# Patient Record
Sex: Male | Born: 1950 | Race: White | Hispanic: No | Smoking: Former smoker
Health system: Southern US, Community
[De-identification: ages and names within clinical notes are randomized; demographics above are authoritative.]

## PROBLEM LIST (undated history)

## (undated) DIAGNOSIS — N200 Calculus of kidney: Secondary | ICD-10-CM

## (undated) DIAGNOSIS — N62 Hypertrophy of breast: Secondary | ICD-10-CM

## (undated) DIAGNOSIS — N179 Acute kidney failure, unspecified: Secondary | ICD-10-CM

## (undated) DIAGNOSIS — G9341 Metabolic encephalopathy: Secondary | ICD-10-CM

## (undated) DIAGNOSIS — I10 Essential (primary) hypertension: Secondary | ICD-10-CM

## (undated) DIAGNOSIS — E119 Type 2 diabetes mellitus without complications: Secondary | ICD-10-CM

## (undated) DIAGNOSIS — G2581 Restless legs syndrome: Secondary | ICD-10-CM

## (undated) DIAGNOSIS — M069 Rheumatoid arthritis, unspecified: Secondary | ICD-10-CM

## (undated) DIAGNOSIS — E7220 Disorder of urea cycle metabolism, unspecified: Secondary | ICD-10-CM

## (undated) DIAGNOSIS — J449 Chronic obstructive pulmonary disease, unspecified: Secondary | ICD-10-CM

## (undated) DIAGNOSIS — F32A Depression, unspecified: Secondary | ICD-10-CM

## (undated) DIAGNOSIS — E785 Hyperlipidemia, unspecified: Secondary | ICD-10-CM

## (undated) DIAGNOSIS — R609 Edema, unspecified: Secondary | ICD-10-CM

## (undated) DIAGNOSIS — K219 Gastro-esophageal reflux disease without esophagitis: Secondary | ICD-10-CM

## (undated) DIAGNOSIS — R188 Other ascites: Secondary | ICD-10-CM

## (undated) DIAGNOSIS — K7460 Unspecified cirrhosis of liver: Secondary | ICD-10-CM

## (undated) DIAGNOSIS — E1140 Type 2 diabetes mellitus with diabetic neuropathy, unspecified: Secondary | ICD-10-CM

## (undated) DIAGNOSIS — I509 Heart failure, unspecified: Secondary | ICD-10-CM

## (undated) DIAGNOSIS — K922 Gastrointestinal hemorrhage, unspecified: Secondary | ICD-10-CM

## (undated) DIAGNOSIS — F329 Major depressive disorder, single episode, unspecified: Secondary | ICD-10-CM

## (undated) DIAGNOSIS — F0390 Unspecified dementia without behavioral disturbance: Secondary | ICD-10-CM

## (undated) HISTORY — DX: Other ascites: R18.8

## (undated) HISTORY — DX: Gastrointestinal hemorrhage, unspecified: K92.2

## (undated) HISTORY — PX: HEMORRHOID SURGERY: SHX153

## (undated) HISTORY — DX: Gastro-esophageal reflux disease without esophagitis: K21.9

## (undated) HISTORY — PX: LUMBAR FUSION: SHX111

## (undated) HISTORY — DX: Disorder of urea cycle metabolism, unspecified: E72.20

## (undated) HISTORY — DX: Restless legs syndrome: G25.81

## (undated) HISTORY — DX: Metabolic encephalopathy: G93.41

## (undated) HISTORY — DX: Unspecified cirrhosis of liver: K74.60

## (undated) HISTORY — DX: Type 2 diabetes mellitus without complications: E11.9

## (undated) HISTORY — DX: Calculus of kidney: N20.0

## (undated) HISTORY — PX: ROTATOR CUFF REPAIR: SHX139

## (undated) HISTORY — DX: Essential (primary) hypertension: I10

## (undated) HISTORY — DX: Rheumatoid arthritis, unspecified: M06.9

## (undated) HISTORY — DX: Edema, unspecified: R60.9

## (undated) HISTORY — DX: Hyperlipidemia, unspecified: E78.5

## (undated) HISTORY — PX: BACK SURGERY: SHX140

## (undated) HISTORY — DX: Hypertrophy of breast: N62

## (undated) HISTORY — DX: Type 2 diabetes mellitus with diabetic neuropathy, unspecified: E11.40

## (undated) HISTORY — DX: Major depressive disorder, single episode, unspecified: F32.9

## (undated) HISTORY — DX: Depression, unspecified: F32.A

## (undated) HISTORY — DX: Heart failure, unspecified: I50.9

## (undated) HISTORY — PX: EYE SURGERY: SHX253

## (undated) HISTORY — DX: Unspecified dementia, unspecified severity, without behavioral disturbance, psychotic disturbance, mood disturbance, and anxiety: F03.90

## (undated) HISTORY — DX: Acute kidney failure, unspecified: N17.9

## (undated) HISTORY — DX: Chronic obstructive pulmonary disease, unspecified: J44.9

---

## 2006-07-28 ENCOUNTER — Inpatient Hospital Stay (HOSPITAL_COMMUNITY): Admission: RE | Admit: 2006-07-28 | Discharge: 2006-07-31 | Payer: Self-pay | Admitting: Neurological Surgery

## 2007-02-07 ENCOUNTER — Encounter: Admission: RE | Admit: 2007-02-07 | Discharge: 2007-02-07 | Payer: Self-pay | Admitting: Neurological Surgery

## 2016-04-17 HISTORY — PX: UPPER GI ENDOSCOPY: SHX6162

## 2017-06-02 LAB — POCT INR: INR: 1.2 — AB (ref 0.9–1.1)

## 2017-06-19 DIAGNOSIS — N62 Hypertrophy of breast: Secondary | ICD-10-CM

## 2017-06-19 HISTORY — DX: Hypertrophy of breast: N62

## 2017-12-25 DIAGNOSIS — N179 Acute kidney failure, unspecified: Secondary | ICD-10-CM

## 2017-12-25 HISTORY — DX: Acute kidney failure, unspecified: N17.9

## 2018-02-09 LAB — HEPATIC FUNCTION PANEL
ALK PHOS: 77 (ref 25–125)
ALT: 26 (ref 10–40)
AST: 35 (ref 14–40)
BILIRUBIN, TOTAL: 2

## 2018-02-13 HISTORY — PX: COLONOSCOPY: SHX174

## 2018-02-13 HISTORY — PX: ESOPHAGOSCOPY: SUR460

## 2018-02-17 LAB — CBC AND DIFFERENTIAL
HEMATOCRIT: 30 — AB (ref 41–53)
HEMOGLOBIN: 10.1 — AB (ref 13.5–17.5)
Platelets: 151 (ref 150–399)
WBC: 6.8

## 2018-02-17 LAB — BASIC METABOLIC PANEL
BUN: 22 — AB (ref 4–21)
Creatinine: 0.8 (ref 0.6–1.3)
Potassium: 4.2 (ref 3.4–5.3)
SODIUM: 136 — AB (ref 137–147)

## 2018-02-20 ENCOUNTER — Non-Acute Institutional Stay (SKILLED_NURSING_FACILITY): Payer: Medicare HMO | Admitting: Internal Medicine

## 2018-02-20 DIAGNOSIS — F32A Depression, unspecified: Secondary | ICD-10-CM

## 2018-02-20 DIAGNOSIS — F0281 Dementia in other diseases classified elsewhere with behavioral disturbance: Secondary | ICD-10-CM

## 2018-02-20 DIAGNOSIS — T796XXD Traumatic ischemia of muscle, subsequent encounter: Secondary | ICD-10-CM | POA: Diagnosis not present

## 2018-02-20 DIAGNOSIS — R188 Other ascites: Secondary | ICD-10-CM

## 2018-02-20 DIAGNOSIS — I4729 Other ventricular tachycardia: Secondary | ICD-10-CM

## 2018-02-20 DIAGNOSIS — G300 Alzheimer's disease with early onset: Secondary | ICD-10-CM

## 2018-02-20 DIAGNOSIS — E876 Hypokalemia: Secondary | ICD-10-CM | POA: Diagnosis not present

## 2018-02-20 DIAGNOSIS — G062 Extradural and subdural abscess, unspecified: Secondary | ICD-10-CM | POA: Diagnosis not present

## 2018-02-20 DIAGNOSIS — R7881 Bacteremia: Secondary | ICD-10-CM

## 2018-02-20 DIAGNOSIS — W19XXXD Unspecified fall, subsequent encounter: Secondary | ICD-10-CM | POA: Diagnosis not present

## 2018-02-20 DIAGNOSIS — E785 Hyperlipidemia, unspecified: Secondary | ICD-10-CM

## 2018-02-20 DIAGNOSIS — K7460 Unspecified cirrhosis of liver: Secondary | ICD-10-CM

## 2018-02-20 DIAGNOSIS — D696 Thrombocytopenia, unspecified: Secondary | ICD-10-CM | POA: Diagnosis not present

## 2018-02-20 DIAGNOSIS — F02818 Dementia in other diseases classified elsewhere, unspecified severity, with other behavioral disturbance: Secondary | ICD-10-CM

## 2018-02-20 DIAGNOSIS — I472 Ventricular tachycardia: Secondary | ICD-10-CM

## 2018-02-20 DIAGNOSIS — E871 Hypo-osmolality and hyponatremia: Secondary | ICD-10-CM | POA: Diagnosis not present

## 2018-02-20 DIAGNOSIS — G9341 Metabolic encephalopathy: Secondary | ICD-10-CM | POA: Diagnosis not present

## 2018-02-20 DIAGNOSIS — J441 Chronic obstructive pulmonary disease with (acute) exacerbation: Secondary | ICD-10-CM

## 2018-02-20 DIAGNOSIS — E1142 Type 2 diabetes mellitus with diabetic polyneuropathy: Secondary | ICD-10-CM

## 2018-02-20 DIAGNOSIS — E1169 Type 2 diabetes mellitus with other specified complication: Secondary | ICD-10-CM

## 2018-02-20 DIAGNOSIS — F329 Major depressive disorder, single episode, unspecified: Secondary | ICD-10-CM

## 2018-02-20 DIAGNOSIS — M25561 Pain in right knee: Secondary | ICD-10-CM

## 2018-02-21 ENCOUNTER — Encounter: Payer: Self-pay | Admitting: Internal Medicine

## 2018-02-21 NOTE — Progress Notes (Signed)
: Provider:  Randon Goldsmith. Lyn Hollingshead, MD Location:  [facility] Room Number: 114P Place of Service:  SNF (31)  PCP: No primary care provider on file. No care team member to display  Extended Emergency Contact Information Primary Emergency Contact: [facility] Address: [address]          [place]  [address] Home Phone: [phone] Relation: None     Allergies: Ace inhibitors; Latex; and Morphine  Chief Complaint  Patient presents with  . New Admit To SNF    HPI: Patient is 67 y.o. male with cirrhosis of the liver with ascites, COPD, diabetes mellitus, who had a mechanical fall 3 days prior to admission apparently laying in the bed for the past 3 days with decreased eating and drinking. Home health nurse found him covered in urine and fecal material and he was brought to the emergency Department via EMS. There he was found to be febrile, tachycardic, leukocyte ptotic, hyponatremic, hypokalemic. Patient was admitted to [facility] from 2/18-3/1 where he was treated for MSSA sepsis and an epidural abscess. Hospital course was complicated by acute metabolic encephalopathy, acute COPD exacerbation treated with inhalers and prednisone and thrombocytopenia which is resolved. Patient had episodes of V. tach on 2/26 and 2/27 nonsustained which responded to low-dose Lopressor. Patient apparently also had right knee pain which was tapped and injected. Patient is admitted to skilled nursing facility with improved with very poor prognosis secondary to multiple comorbidities. While at skilled nursing facility patient will be followed for hyperlipidemia treated with Lipitor, dementia treated with Aricept and diabetes treated with metformin.  Past Medical History:  Diagnosis Date  . AKI (acute kidney injury) (HCC) 12/25/2017  . CHF (congestive heart failure) (HCC)    diastolic  . Cirrhosis of liver with ascites (HCC)   . COPD (chronic obstructive pulmonary  disease) (HCC)   . Dementia   . Depressed   . Diabetes mellitus (HCC)   . Diabetic neuropathy (HCC)   . Edema   . GERD (gastroesophageal reflux disease)   . GI bleed   . Gynecomastia, male 06/2017  . Hyperammonemia (HCC)   . Hyperlipidemia   . Hypertension   . Kidney stones   . Liver cirrhosis (HCC)   . Major depression   . Metabolic encephalopathy   . RA (rheumatoid arthritis) (HCC)   . Restless leg syndrome     Past Surgical History:  Procedure Laterality Date  . BACK SURGERY    . COLONOSCOPY  02/13/2018   Procedure: Colonoscopy; Surgeon: Chaney Born, MD; Location: [facility] ENDO OR; Service: Gastroenterology; Laterality: N/A;  . ESOPHAGOSCOPY  02/13/2018   Procedure: EGD; Surgeon: Chaney Born, MD; Location: [facility] END[facility] OR; Service: Gastroenterology; Laterality: N/A;  . EYE SURGERY    . HEMORRHOID SURGERY    . LUMBAR FUSION    . ROTATOR CUFF REPAIR Bilateral   . UPPER GI ENDOSCOPY Left 04/17/2016   Procedure: UGI ENDO, INCLUDE ESOPHAGUS, STOMACH, & DUODENUM &/OR JEJUNUM; DX W/WO COLLECTION SPECIMN,; Surgeon: Stasia Cavalier, MD; Location: Endo Procedures [facility]; Service: Gastroenterology    Allergies as of 02/20/2018   Not on File     Medication List        Accurate as of 02/20/18 11:59 PM. Always use your most recent med list.          acetaminophen 325 MG tablet Commonly known as:  TYLENOL Take 650 mg by mouth every 6 (six) hours as needed.  albuterol 108 (90 Base) MCG/ACT inhaler Commonly known as:  PROVENTIL HFA;VENTOLIN HFA Inhale 2 puffs into the lungs every 4 (four) hours as needed.   aspirin 81 MG chewable tablet Chew 81 mg by mouth daily.   atorvastatin 20 MG tablet Commonly known as:  LIPITOR Take 20 mg by mouth daily.   ceFAZolin 2-3 GM-%(50ML) Solr Commonly known as:  ANCEF Inject 2 g into the vein every 8 (eight) hours.   donepezil 5 MG tablet Commonly known as:  ARICEPT Take 5 mg by mouth at bedtime.   ferrous sulfate  325 (65 FE) MG tablet Take 325 mg by mouth 2 (two) times daily with a meal.   fluticasone-salmeterol 115-21 MCG/ACT inhaler Commonly known as:  ADVAIR HFA Inhale 2 puffs into the lungs daily.   folic acid 1 MG tablet Commonly known as:  FOLVITE Take 1 mg by mouth daily.   furosemide 20 MG tablet Commonly known as:  LASIX Take 40 mg by mouth daily.   magnesium oxide 400 MG tablet Commonly known as:  MAG-OX Take 400 mg by mouth 2 (two) times daily.   metFORMIN 500 MG tablet Commonly known as:  GLUCOPHAGE Take 500 mg by mouth 2 (two) times daily with a meal.   methocarbamol 500 MG tablet Commonly known as:  ROBAXIN Take 500 mg by mouth 3 (three) times daily.   metoprolol tartrate 25 MG tablet Commonly known as:  LOPRESSOR Take 12.5 mg by mouth 2 (two) times daily.   mirtazapine 30 MG tablet Commonly known as:  REMERON Take 30 mg by mouth at bedtime.   nitroGLYCERIN 0.4 MG SL tablet Commonly known as:  NITROSTAT Place 0.4 mg under the tongue as needed. Every 5 minutes as needed for chest pain. Call Dr/911 if take 2 doses. Max 3/day   ondansetron 4 MG tablet Commonly known as:  ZOFRAN Take 4 mg by mouth as needed. Every 6 - 8 hours   oxycodone 5 MG capsule Commonly known as:  OXY-IR Take 5 mg by mouth 3 (three) times daily as needed.   pantoprazole 40 MG tablet Commonly known as:  PROTONIX Take 40 mg by mouth daily.   potassium chloride SA 20 MEQ tablet Commonly known as:  K-DUR,KLOR-CON Take 20 mEq by mouth daily.   pramipexole 1.5 MG tablet Commonly known as:  MIRAPEX Take 1.5 mg by mouth every evening.   pregabalin 25 MG capsule Commonly known as:  LYRICA Take 50 mg by mouth 3 (three) times daily.   QUEtiapine 50 MG tablet Commonly known as:  SEROQUEL Take 50 mg by mouth at bedtime.   sertraline 50 MG tablet Commonly known as:  ZOLOFT Take 50 mg by mouth daily.   spironolactone 50 MG tablet Commonly known as:  ALDACTONE Take 50 mg by mouth  daily.   traZODone 50 MG tablet Commonly known as:  DESYREL Take 50 mg by mouth at bedtime.   vitamin C 250 MG tablet Commonly known as:  ASCORBIC ACID Take 250 mg by mouth 2 (two) times daily.   Vitamin D (Ergocalciferol) 50000 units Caps capsule Commonly known as:  DRISDOL Take 50,000 Units by mouth once a week.       No orders of the defined types were placed in this encounter.   Immunization History  Administered Date(s) Administered  . Influenza, High Dose Seasonal PF 10/05/2016  . Influenza, Seasonal, Injecte, Preservative Fre 10/17/2015  . Influenza-Unspecified 10/17/2015, 10/05/2016, 08/20/2017  . Pneumococcal Conjugate-13 11/24/2017, 11/24/2017  . Pneumococcal Polysaccharide-23  06/17/2016, 06/17/2016  . Pneumococcal-Unspecified 06/17/2016    Social History   Tobacco Use  . Smoking status: Former Smoker    Last attempt to quit: 07/12/2005    Years since quitting: 12.6  . Smokeless tobacco: Never Used  Substance Use Topics  . Alcohol use: No    Frequency: Never    Comment: Alcohol use years ago; patient was also diagnosed with cirrhosis years ago    Family history is   Family History  Problem Relation Age of Onset  . Stomach cancer Mother   . Stroke Father   . Cancer Sister       Review of Systems  DATA OBTAINED: from patient, nurse GENERAL:  no fevers, fatigue, appetite changes SKIN: No itching, or rash EYES: No eye pain, redness, discharge EARS: No earache, tinnitus, change in hearing NOSE: No congestion, drainage or bleeding  MOUTH/THROAT: No mouth or tooth pain, No sore throat RESPIRATORY: No cough, wheezing, SOB CARDIAC: No chest pain, palpitations, lower extremity edema  GI: No abdominal pain, No N/V/D or constipation, No heartburn or reflux  GU: No dysuria, frequency or urgency, or incontinence  MUSCULOSKELETAL: No unrelieved bone/joint pain NEUROLOGIC: No headache, dizziness or focal weakness PSYCHIATRIC: No c/o anxiety or sadness    Vitals:   02/20/18 1214  BP: 120/68  Pulse: 68  Resp: 20  Temp: 98 F (36.7 C)  SpO2: 97%    SpO2 Readings from Last 1 Encounters:  02/20/18 97%   Body mass index is 33.71 kg/m.     Physical Exam  GENERAL APPEARANCE: Alert, conversant,  No acute distress.  SKIN: No diaphoresis rash; bronze color skin HEAD: Normocephalic, atraumatic  EYES: Conjunctiva/lids clear. Pupils round, reactive. EOMs intact.  EARS: External exam WNL, canals clear. Hearing grossly normal.  NOSE: No deformity or discharge.  MOUTH/THROAT: Lips w/o lesions  RESPIRATORY: Breathing is even, unlabored. Lung sounds are clear   CARDIOVASCULAR: Heart RRR no murmurs, rubs or gallops. 2+ peripheral edema.   GASTROINTESTINAL: Abdomen is soft, non-tender, not distended w/ normal bowel sounds. GENITOURINARY: Bladder non tender, not distended  MUSCULOSKELETAL: No abnormal joints or musculature NEUROLOGIC:  Cranial nerves 2-12 grossly intact. Moves all extremities  PSYCHIATRIC: Mood and affect appropriate to situation with dementia, no behavioral issues  There are no active problems to display for this patient.     Labs reviewed: Basic Metabolic Panel:    Component Value Date/Time   NA 136 (A) 02/17/2018   K 4.2 02/17/2018   BUN 22 (A) 02/17/2018   CREATININE 0.8 02/17/2018   AST 35 02/09/2018   ALT 26 02/09/2018   ALKPHOS 77 02/09/2018    Recent Labs    02/17/18  NA 136*  K 4.2  BUN 22*  CREATININE 0.8   Liver Function Tests: Recent Labs    02/09/18  AST 35  ALT 26  ALKPHOS 77   No results for input(s): LIPASE, AMYLASE in the last 8760 hours. No results for input(s): AMMONIA in the last 8760 hours. CBC: Recent Labs    02/17/18  WBC 6.8  HGB 10.1*  HCT 30*  PLT 151   Lipid No results for input(s): CHOL, HDL, LDLCALC, TRIG in the last 8760 hours.  Cardiac Enzymes: No results for input(s): CKTOTAL, CKMB, CKMBINDEX, TROPONINI in the last 8760 hours. BNP: No results for  input(s): BNP in the last 8760 hours. No results found for: MICROALBUR No results found for: HGBA1C No results found for: TSH No results found for: VITAMINB12 No results found  for: FOLATE No results found for: IRON, TIBC, FERRITIN  Imaging and Procedures obtained prior to SNF admission: Ct Lumbar Spine Wo Contrast  Result Date: 02/07/2007 Clinical Data:  Post-L5-S1 fusion 07/28/06 with low back pain.  LUMBAR SPINE CT WITHOUT CONTRAST: Technique:  Multidetector CT imaging of the lumbar spine was performed.  Multiplanar CT image reconstructions were also generated. Comparison:  [facility] intraoperative lumbar spine radiographs 07/28/06. Findings:  T12-L1:  The disc appears normal with no neural impingement. L1-2:  Slight diffuse annular disc bulge is seen with no direct nerve root impingement nor significant central canal stenosis.  L2-3 and L3-4:  Slight to moderate diffuse annular disc bulge with congenitally short pedicles demonstrates borderline to slight central canal stenosis with no direct nerve root impingement seen. L4-5:  Moderate diffuse annular disc bulge is seen with posterior ligamenta flava silhouetted by posterior laminectomy post operative epidural fibrosis.  Again borderline to slight central canal stenosis is seen with no direct nerve root impingement. L5-S1:  Posterior laminectomy with transpedicular screws noted with screws well placed with no surrounding osteolysis/loosening.  Interbody bone plug appears well placed with complete fusion at the L-5 end plate and near complete fusion at the S-1 end plate. Posterior vertebral alignment is normally maintained.  No new osseous lesions are seen.  Slight to moderate vascular calcification is noted with normal sized abdominal aorta. IMPRESSION: 1.  Postlaminectomy fusion changes L5-S1 appears satisfactory without complicating feature.  2.  Borderline to slight central canal stenosis due to congential and acquired factors from L2-3 through L4-5 most  marked at L4-5. 3.  Slight disc bulges L2-3 through L4-5. 4.  Slight to moderate atheromatous vascular calcification with normal sized abdominal aorta. Provider: Lanice Shirts    Not all labs, radiology exams or other studies done during hospitalization come through on my EPIC note; however they are reviewed by me.    Assessment and Plan  MSSA sepsis/epidural abscess-TTE rule out vegetations, EF 55-60%, flu negative, chest x-ray-emphysema no active lung disease; CT abdomen pelvis-cirrhosis MRI L-spine-fluid collection paraspinal muscles associated with a left L3-4 facet measuring up to 3.2 cm probably representing abscess and septic arthritis, epidural abscesses at L3-4 disc and L4 vertebral body level; patient initially on vancomycin and ID consult,, changed to cefazolin 2 g IV every 8 on 02/10/2018 to go for 12 weeks; neurosurgery recommended no surgical intervention; Lyrica increased to 50 mg 3 times a day, Robaxin 500 mg 3 times a day for pain SNF - admitted for OT/PT; watch for increased extremity weakness or bowel incontinence as this may be a sign of worsening abscess; continue Rocephin 2 g IV every 8 for 12 weeks  Fall/mild rhabdo/hypomagnesemia/hypokalemia/hyponatremia/hypophosphatemia/right knee pain-probably related to sepsis; all x-rays are negative, right knee showed an effusion which orthopedics PDX aspirated and injected on 2/28 with resolution of pain-patient treated with IV fluids to the point that he needed his Lasix and spironolactone restarted; magnesium was repleted IV and then patient placed on oral mag, hypokalemia was repleted hyponatremia resolved and phosphorus was repleted IV SNF - OT/PT; follow-up BMP  Cirrhosis of liver with ascites/thrombocytopenia/metabolic encephalopathy-patient with history of liver cirrhosis, portal hypertension and GI bleed-recommend Lasix 40 mg daily for 10 days then 20 mg daily SNF - Lasix 40 mg daily for 10 days then 20 mg daily; continue Aldactone  50 mg daily and a follow-up CBC  COPD exacerbation-with shortness risen wheezing; 92% on room air; prednisone has been completed SNF - continue when necessary albuterol, Advair 1:15/21 2  puffs daily  Ventricular tachycardia, nonsustained-episode on 2/26 and 2/27; low-dose Lopressor 12.5 twice a day resumed SNF - continue Lopressor 12.5 mg by mouth twice a day  Diabetes mellitus type 2-11 A1c 5.3 SNF - continue metformin 500 mg by mouth twice a day; patient is on statin, not on an Ace  Hyperlipidemia-not stated as uncontrolled SNF - continue Lipitor 20 mg by mouth daily  Dementia SNF - continue Aricept 5 mg by mouth daily  Depression SNF - on multiple meds; continue Remeron 30 mg daily at bedtime, Zoloft 50 mg daily and trazodone 50 mg daily   Time spent greater than 45 minutes;> 50% of time with patient was spent reviewing records, labs, tests and studies, counseling and developing plan of care  Thurston Hole D. Lyn Hollingshead, MD

## 2018-02-22 ENCOUNTER — Encounter: Payer: Self-pay | Admitting: Internal Medicine

## 2018-02-22 DIAGNOSIS — R188 Other ascites: Secondary | ICD-10-CM

## 2018-02-22 DIAGNOSIS — E785 Hyperlipidemia, unspecified: Secondary | ICD-10-CM

## 2018-02-22 DIAGNOSIS — I472 Ventricular tachycardia: Secondary | ICD-10-CM | POA: Insufficient documentation

## 2018-02-22 DIAGNOSIS — I4729 Other ventricular tachycardia: Secondary | ICD-10-CM | POA: Insufficient documentation

## 2018-02-22 DIAGNOSIS — E876 Hypokalemia: Secondary | ICD-10-CM | POA: Insufficient documentation

## 2018-02-22 DIAGNOSIS — W19XXXA Unspecified fall, initial encounter: Secondary | ICD-10-CM | POA: Insufficient documentation

## 2018-02-22 DIAGNOSIS — E871 Hypo-osmolality and hyponatremia: Secondary | ICD-10-CM | POA: Insufficient documentation

## 2018-02-22 DIAGNOSIS — M25561 Pain in right knee: Secondary | ICD-10-CM | POA: Insufficient documentation

## 2018-02-22 DIAGNOSIS — G062 Extradural and subdural abscess, unspecified: Secondary | ICD-10-CM | POA: Insufficient documentation

## 2018-02-22 DIAGNOSIS — G9341 Metabolic encephalopathy: Secondary | ICD-10-CM | POA: Insufficient documentation

## 2018-02-22 DIAGNOSIS — F329 Major depressive disorder, single episode, unspecified: Secondary | ICD-10-CM | POA: Insufficient documentation

## 2018-02-22 DIAGNOSIS — E1169 Type 2 diabetes mellitus with other specified complication: Secondary | ICD-10-CM | POA: Insufficient documentation

## 2018-02-22 DIAGNOSIS — R7881 Bacteremia: Principal | ICD-10-CM

## 2018-02-22 DIAGNOSIS — F32A Depression, unspecified: Secondary | ICD-10-CM | POA: Insufficient documentation

## 2018-02-22 DIAGNOSIS — F0390 Unspecified dementia without behavioral disturbance: Secondary | ICD-10-CM | POA: Insufficient documentation

## 2018-02-22 DIAGNOSIS — J441 Chronic obstructive pulmonary disease with (acute) exacerbation: Secondary | ICD-10-CM | POA: Insufficient documentation

## 2018-02-22 DIAGNOSIS — M6282 Rhabdomyolysis: Secondary | ICD-10-CM | POA: Insufficient documentation

## 2018-02-22 DIAGNOSIS — E119 Type 2 diabetes mellitus without complications: Secondary | ICD-10-CM | POA: Insufficient documentation

## 2018-02-22 DIAGNOSIS — K7460 Unspecified cirrhosis of liver: Secondary | ICD-10-CM | POA: Insufficient documentation

## 2018-02-22 DIAGNOSIS — D696 Thrombocytopenia, unspecified: Secondary | ICD-10-CM | POA: Insufficient documentation

## 2018-03-03 ENCOUNTER — Non-Acute Institutional Stay (SKILLED_NURSING_FACILITY): Payer: Medicare HMO | Admitting: Internal Medicine

## 2018-03-03 DIAGNOSIS — K7031 Alcoholic cirrhosis of liver with ascites: Secondary | ICD-10-CM | POA: Diagnosis not present

## 2018-03-05 LAB — CBC AND DIFFERENTIAL
HEMATOCRIT: 26 — AB (ref 41–53)
Hemoglobin: 8.8 — AB (ref 13.5–17.5)
PLATELETS: 96 — AB (ref 150–399)
WBC: 6.7

## 2018-03-05 LAB — POCT INR: INR: 1.3 — AB (ref 0.9–1.1)

## 2018-03-05 LAB — BASIC METABOLIC PANEL
BUN: 15 (ref 4–21)
Creatinine: 0.7 (ref 0.6–1.3)
GLUCOSE: 101
POTASSIUM: 3.9 (ref 3.4–5.3)
SODIUM: 141 (ref 137–147)

## 2018-03-05 LAB — HEPATIC FUNCTION PANEL
ALT: 9 — AB (ref 10–40)
AST: 48 — AB (ref 14–40)
Alkaline Phosphatase: 120 (ref 25–125)
Bilirubin, Total: 1.5

## 2018-03-11 ENCOUNTER — Encounter: Payer: Self-pay | Admitting: Internal Medicine

## 2018-03-11 NOTE — Progress Notes (Signed)
Location:  [facility]   Place of Service:  SNF (31)  Margit Hanks, MD  Patient Care Team: Margit Hanks, MD as PCP - General (Internal Medicine)  Extended Emergency Contact Information Primary Emergency Contact: [place] Home Phone: [phone] Relation: Brother    Allergies: Ace inhibitors; Latex; and Morphine  Chief Complaint  Patient presents with  . Acute Visit    HPI: Patient is 67 y.o. male who nursing day for complaint of shortness of breath.  Shortness of breath is worse with exertion, like walking to the bathroom.  The very visible problem is patient has huge ascites.  Past Medical History:  Diagnosis Date  . AKI (acute kidney injury) (HCC) 12/25/2017  . CHF (congestive heart failure) (HCC)    diastolic  . Cirrhosis of liver with ascites (HCC)   . COPD (chronic obstructive pulmonary disease) (HCC)   . Dementia   . Depressed   . Diabetes mellitus (HCC)   . Diabetic neuropathy (HCC)   . Edema   . GERD (gastroesophageal reflux disease)   . GI bleed   . Gynecomastia, male 06/2017  . Hyperammonemia (HCC)   . Hyperlipidemia   . Hypertension   . Kidney stones   . Liver cirrhosis (HCC)   . Major depression   . Metabolic encephalopathy   . RA (rheumatoid arthritis) (HCC)   . Restless leg syndrome     Past Surgical History:  Procedure Laterality Date  . BACK SURGERY    . COLONOSCOPY  02/13/2018   Procedure: Colonoscopy; Surgeon: Chaney Born, MD; Location: [facility] OR; Service: Gastroenterology; Laterality: N/A;  . ESOPHAGOSCOPY  02/13/2018   Procedure: EGD; Surgeon: Chaney Born, MD; Location: [facility] END[facility] OR; Service: Gastroenterology; Laterality: N/A;  . EYE SURGERY    . HEMORRHOID SURGERY    . LUMBAR FUSION    . ROTATOR CUFF REPAIR Bilateral   . UPPER GI ENDOSCOPY Left 04/17/2016   Procedure: UGI ENDO, INCLUDE ESOPHAGUS, STOMACH, & DUODENUM &/OR JEJUNUM; DX W/WO COLLECTION SPECIMN,; Surgeon: Stasia Cavalier, MD; Location: [facility]; Service: Gastroenterology    Allergies as of 03/03/2018      Reactions   Ace Inhibitors Swelling   Other reaction(s): Unknown   Latex Rash   "Latex Tape" per patient "Latex Tape" per patient "Latex Tape" per patient   Morphine Nausea And Vomiting   Other reaction(s): Unknown      Medication List        Accurate as of 03/03/18 11:59 PM. Always use your most recent med list.          acetaminophen 325 MG tablet Commonly known as:  TYLENOL Take 650 mg by mouth every 6 (six) hours as needed.   albuterol 108 (90 Base) MCG/ACT inhaler Commonly known as:  PROVENTIL HFA;VENTOLIN HFA Inhale 2 puffs into the lungs every 4 (four) hours as needed.   aspirin 81 MG chewable tablet Chew 81 mg by mouth daily.   atorvastatin 20 MG tablet Commonly known as:  LIPITOR Take 20 mg by mouth daily.   ceFAZolin 2-3 GM-%(50ML) Solr Commonly known as:  ANCEF Inject 2 g into the vein every 8 (eight) hours.   donepezil 5 MG tablet Commonly known as:  ARICEPT Take 5 mg by mouth at bedtime.   ferrous sulfate 325 (65 FE) MG tablet Take 325 mg by mouth 2 (two) times daily with a meal.   fluticasone-salmeterol 115-21 MCG/ACT inhaler Commonly known as:  ADVAIR HFA Inhale 2  puffs into the lungs daily.   folic acid 1 MG tablet Commonly known as:  FOLVITE Take 1 mg by mouth daily.   furosemide 20 MG tablet Commonly known as:  LASIX Take 40 mg by mouth daily.   magnesium oxide 400 MG tablet Commonly known as:  MAG-OX Take 400 mg by mouth 2 (two) times daily.   metFORMIN 500 MG tablet Commonly known as:  GLUCOPHAGE Take 500 mg by mouth 2 (two) times daily with a meal.   methocarbamol 500 MG tablet Commonly known as:  ROBAXIN Take 500 mg by mouth 3 (three) times daily.   metoprolol tartrate 25 MG tablet Commonly known as:  LOPRESSOR Take 12.5 mg by mouth 2 (two) times daily.   mirtazapine 30 MG tablet Commonly known as:   REMERON Take 30 mg by mouth at bedtime.   nitroGLYCERIN 0.4 MG SL tablet Commonly known as:  NITROSTAT Place 0.4 mg under the tongue as needed. Every 5 minutes as needed for chest pain. Call Dr/911 if take 2 doses. Max 3/day   ondansetron 4 MG tablet Commonly known as:  ZOFRAN Take 4 mg by mouth as needed. Every 6 - 8 hours   oxycodone 5 MG capsule Commonly known as:  OXY-IR Take 5 mg by mouth 3 (three) times daily as needed.   pantoprazole 40 MG tablet Commonly known as:  PROTONIX Take 40 mg by mouth daily.   potassium chloride SA 20 MEQ tablet Commonly known as:  K-DUR,KLOR-CON Take 20 mEq by mouth daily.   pramipexole 1.5 MG tablet Commonly known as:  MIRAPEX Take 1.5 mg by mouth every evening.   pregabalin 25 MG capsule Commonly known as:  LYRICA Take 50 mg by mouth 3 (three) times daily.   QUEtiapine 50 MG tablet Commonly known as:  SEROQUEL Take 50 mg by mouth at bedtime.   sertraline 50 MG tablet Commonly known as:  ZOLOFT Take 50 mg by mouth daily.   spironolactone 50 MG tablet Commonly known as:  ALDACTONE Take 50 mg by mouth daily.   traZODone 50 MG tablet Commonly known as:  DESYREL Take 50 mg by mouth at bedtime.   vitamin C 250 MG tablet Commonly known as:  ASCORBIC ACID Take 250 mg by mouth 2 (two) times daily.   Vitamin D (Ergocalciferol) 50000 units Caps capsule Commonly known as:  DRISDOL Take 50,000 Units by mouth once a week.       No orders of the defined types were placed in this encounter.   Immunization History  Administered Date(s) Administered  . Influenza, High Dose Seasonal PF 10/05/2016  . Influenza, Seasonal, Injecte, Preservative Fre 10/17/2015  . Influenza-Unspecified 10/17/2015, 10/05/2016, 08/20/2017  . Pneumococcal Conjugate-13 11/24/2017, 11/24/2017  . Pneumococcal Polysaccharide-23 06/17/2016, 06/17/2016  . Pneumococcal-Unspecified 06/17/2016    Social History   Tobacco Use  . Smoking status: Former Smoker     Last attempt to quit: 07/12/2005    Years since quitting: 12.6  . Smokeless tobacco: Never Used  Substance Use Topics  . Alcohol use: No    Frequency: Never    Comment: Alcohol use years ago; patient was also diagnosed with cirrhosis years ago    Review of Systems  DATA OBTAINED: from patient, nurse GENERAL:  no fevers,+ fatigue, appetite changes SKIN: No itching, rash HEENT: No complaint RESPIRATORY: No cough, wheezing, +SOB,+DOE CARDIAC: No chest pain, palpitations, lower extremity edema  GI: No abdominal pain, No N/V/D or constipation, No heartburn or reflux  GU: No dysuria, frequency  or urgency, or incontinence  MUSCULOSKELETAL: No unrelieved bone/joint pain NEUROLOGIC: No headache, dizziness  PSYCHIATRIC: No overt anxiety or sadness  Vitals:   03/11/18 1150  BP: 120/68  Pulse: 68  Resp: 20  Temp: 98 F (36.7 C)   There is no height or weight on file to calculate BMI. Physical Exam  GENERAL APPEARANCE: Alert, conversant, No acute distress, but breathless from his walk from the bathroom to the bed SKIN: No diaphoresis rash HEENT: Unremarkable RESPIRATORY: Breathing is even, unlabored. Lung sounds are rales at bases: Pulse ox at room air at bedside 95% CARDIOVASCULAR: Heart RRR no murmurs, rubs or gallops. No peripheral edema  GASTROINTESTINAL: Abdomen is firm, very distended w/ normal bowel sounds, mild diffuse tenderness to palpation me: No pain with walking GENITOURINARY: Bladder non tender, not distended  MUSCULOSKELETAL: No abnormal joints or musculature NEUROLOGIC: Cranial nerves 2-12 grossly intact. Moves all extremities PSYCHIATRIC: Mood and affect appropriate to situation, no behavioral issues  Patient Active Problem List   Diagnosis Date Noted  . MSSA bacteremia 02/22/2018  . 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 Fall 02/22/2018  . Rhabdomyolysis 02/22/2018  . Hypomagnesemia 02/22/2018  . Hypokalemia 02/22/2018  . Hyponatremia 02/22/2018  .  Hypophosphatemia 02/22/2018  . Knee pain, right 02/22/2018  . Cirrhosis of liver with ascites (HCC) 02/22/2018  . Thrombocytopenia (HCC) 02/22/2018  . Metabolic encephalopathy 02/22/2018  . COPD exacerbation (HCC) 02/22/2018  . Ventricular tachycardia, nonsustained (HCC) 02/22/2018  . Diabetes mellitus (HCC) 02/22/2018  . Hyperlipidemia associated with type 2 diabetes mellitus (HCC) 02/22/2018  . Dementia 02/22/2018  . Depressed 02/22/2018    CMP     Component Value Date/Time   NA 136 (A) 02/17/2018   K 4.2 02/17/2018   BUN 22 (A) 02/17/2018   CREATININE 0.8 02/17/2018   AST 35 02/09/2018   ALT 26 02/09/2018   ALKPHOS 77 02/09/2018   Recent Labs    02/17/18  NA 136*  K 4.2  BUN 22*  CREATININE 0.8   Recent Labs    02/09/18  AST 35  ALT 26  ALKPHOS 77   Recent Labs    02/17/18  WBC 6.8  HGB 10.1*  HCT 30*  PLT 151   No results for input(s): CHOL, LDLCALC, TRIG in the last 8760 hours.  Invalid input(s): HCL No results found for: MICROALBUR No results found for: TSH No results found for: HGBA1C No results found for: CHOL, HDL, LDLCALC, LDLDIRECT, TRIG, CHOLHDL  Significant Diagnostic Results in last 30 days:  No results found.  Assessment and Plan  Cirrhosis of liver with ascites-patient needs to undergo paracentesis today, Friday; unable to get patient set up for today, hampered by the fact that [facility]; patient is set up for Monday; if he has any problems at all over the weekend he is to be sent to the emergency department    Merrilee SeashoreAnne Alexzander Dolinger, MD

## 2018-03-13 ENCOUNTER — Non-Acute Institutional Stay (SKILLED_NURSING_FACILITY): Payer: Medicare HMO | Admitting: Internal Medicine

## 2018-03-13 ENCOUNTER — Encounter: Payer: Self-pay | Admitting: Internal Medicine

## 2018-03-13 DIAGNOSIS — K7460 Unspecified cirrhosis of liver: Secondary | ICD-10-CM | POA: Diagnosis not present

## 2018-03-13 DIAGNOSIS — G062 Extradural and subdural abscess, unspecified: Secondary | ICD-10-CM | POA: Diagnosis not present

## 2018-03-13 DIAGNOSIS — I5032 Chronic diastolic (congestive) heart failure: Secondary | ICD-10-CM

## 2018-03-13 DIAGNOSIS — G300 Alzheimer's disease with early onset: Secondary | ICD-10-CM | POA: Diagnosis not present

## 2018-03-13 DIAGNOSIS — F0281 Dementia in other diseases classified elsewhere with behavioral disturbance: Secondary | ICD-10-CM

## 2018-03-13 DIAGNOSIS — F39 Unspecified mood [affective] disorder: Secondary | ICD-10-CM | POA: Diagnosis not present

## 2018-03-13 DIAGNOSIS — F02818 Dementia in other diseases classified elsewhere, unspecified severity, with other behavioral disturbance: Secondary | ICD-10-CM

## 2018-03-13 DIAGNOSIS — R188 Other ascites: Secondary | ICD-10-CM

## 2018-03-13 NOTE — Progress Notes (Signed)
: Provider:  Randon GoldsmithAnne D. Lyn HollingsheadAlexander, MD [facility]  [facility] Room Number: 317P Place of Service:  SNF ([phone])  PCP: Margit HanksAlexander, Caydon Feasel D, MD Patient Care Team: Margit HanksAlexander, Garlen Reinig D, MD as PCP - General (Internal Medicine)  Extended Emergency Contact Information Primary Emergency Contact: Barham,Homer Home Phone: [phone] Relation: Brother     Allergies: Ace inhibitors; Latex; and Morphine  Chief Complaint  Patient presents with  . New Admit To SNF    Admit to Facility    HPI: Patient is 67 y.o. male history of cirrhosis of the liver with portal hypertension and ascites who was admitted from rehab on 3/17 2019 with shortness of breath and abdominal distention.  Patient was admitted to [facility] from 3/16-22 where he was found to be short of breath but with an O2 sat of 94% on room air but massive ascites.  Patient was started on IV Rocephin for possible SBP because of abdominal pain with the ascites and then had an ultrasound-guided paracentesis done on 3/18 with removal of 6 L of clear yellow fluid.  It was recently discharged on 3/1/night teen for epidural abscesses involving L3/L4 and is currently on cefazolin 2 g IV every 8 until 05/04/2018.  Patient is admitted back to skilled nursing facility for continue IV cefazolin until 05/04/2018.  While at skilled nursing facility patient will be followed for chronic diastolic congestive heart failure treated with metoprolol and Spironolactone and Lasix, dementia treated with Aricept and mood disorder treated with Seroquel.  Past Medical History:  Diagnosis Date  . AKI (acute kidney injury) (HCC) 12/25/2017  . CHF (congestive heart failure) (HCC)    diastolic  . Cirrhosis of liver with ascites (HCC)   . COPD (chronic obstructive pulmonary disease) (HCC)   . Dementia   . Depressed   . Diabetes mellitus (HCC)   . Diabetic neuropathy (HCC)   . Edema   . GERD (gastroesophageal reflux disease)   . GI  bleed   . Gynecomastia, male 06/2017  . Hyperammonemia (HCC)   . Hyperlipidemia   . Hypertension   . Kidney stones   . Liver cirrhosis (HCC)   . Major depression   . Metabolic encephalopathy   . RA (rheumatoid arthritis) (HCC)   . Restless leg syndrome     Past Surgical History:  Procedure Laterality Date  . BACK SURGERY    . COLONOSCOPY  02/13/2018   Procedure: Colonoscopy; Surgeon: Chaney Bornami Jamil Badreddine, MD; Location: [facility] ENDO OR; Service: Gastroenterology; Laterality: N/A;  . ESOPHAGOSCOPY  02/13/2018   Procedure: EGD; Surgeon: Chaney Bornami Jamil Badreddine, MD; Location: [facility] ENDO OR; Service: Gastroenterology; Laterality: N/A;  . EYE SURGERY    . HEMORRHOID SURGERY    . LUMBAR FUSION    . ROTATOR CUFF REPAIR Bilateral   . UPPER GI ENDOSCOPY Left 04/17/2016   Procedure: UGI ENDO, INCLUDE ESOPHAGUS, STOMACH, & DUODENUM &/OR JEJUNUM; DX W/WO COLLECTION SPECIMN,; Surgeon: Stasia CavalierAlbert John Rhoton, MD; [facility]; Service: Gastroenterology    Allergies as of 03/13/2018      Reactions   Ace Inhibitors Swelling   Other reaction(s): Unknown   Latex Rash   "Latex Tape" per patient "Latex Tape" per patient "Latex Tape" per patient   Morphine Nausea And Vomiting   Other reaction(s): Unknown      Medication List        Accurate as of 03/13/18  8:44 AM. Always use your most recent med list.  acetaminophen 325 MG tablet Commonly known as:  TYLENOL Take 650 mg by mouth every 6 (six) hours as needed.   albuterol 108 (90 Base) MCG/ACT inhaler Commonly known as:  PROVENTIL HFA;VENTOLIN HFA Inhale 2 puffs into the lungs every 4 (four) hours as needed.   aspirin 81 MG chewable tablet Chew 81 mg by mouth daily.   atorvastatin 20 MG tablet Commonly known as:  LIPITOR Take 20 mg by mouth daily.   ceFAZolin 2-3 GM-%(50ML) Solr Commonly known as:  ANCEF Inject 2 g into the vein every 8 (eight) hours.   donepezil 5 MG tablet Commonly known as:   ARICEPT Take 5 mg by mouth at bedtime.   ferrous sulfate 325 (65 FE) MG tablet Take 325 mg by mouth 2 (two) times daily with a meal.   fluticasone-salmeterol 115-21 MCG/ACT inhaler Commonly known as:  ADVAIR HFA Inhale 2 puffs into the lungs daily.   folic acid 1 MG tablet Commonly known as:  FOLVITE Take 1 mg by mouth daily.   furosemide 20 MG tablet Commonly known as:  LASIX Take 40 mg by mouth daily. 2 tabs   lidocaine 2 % jelly Commonly known as:  XYLOCAINE Apply 1 application topically 2 (two) times daily.   magnesium oxide 400 MG tablet Commonly known as:  MAG-OX Take 400 mg by mouth 2 (two) times daily.   metFORMIN 500 MG tablet Commonly known as:  GLUCOPHAGE Take 500 mg by mouth 2 (two) times daily with a meal.   metoprolol tartrate 25 MG tablet Commonly known as:  LOPRESSOR Take 12.5 mg by mouth 2 (two) times daily.   mirtazapine 30 MG tablet Commonly known as:  REMERON Take 30 mg by mouth at bedtime.   nitroGLYCERIN 0.4 MG SL tablet Commonly known as:  NITROSTAT Place 0.4 mg under the tongue as needed. Every 5 minutes as needed for chest pain. Call Dr/911 if take 2 doses. Max 3/day   ondansetron 4 MG tablet Commonly known as:  ZOFRAN Take 4 mg by mouth as needed. Every 6 - 8 hours   oxycodone 5 MG capsule Commonly known as:  OXY-IR Take 5 mg by mouth every 4 (four) hours as needed.   pantoprazole 40 MG tablet Commonly known as:  PROTONIX Take 40 mg by mouth daily.   potassium chloride SA 20 MEQ tablet Commonly known as:  K-DUR,KLOR-CON Take 20 mEq by mouth daily.   pramipexole 1.5 MG tablet Commonly known as:  MIRAPEX Take 1.5 mg by mouth every evening.   pregabalin 25 MG capsule Commonly known as:  LYRICA Take 50 mg by mouth 3 (three) times daily. 2 caps   QUEtiapine 50 MG tablet Commonly known as:  SEROQUEL Take 50 mg by mouth at bedtime.   sertraline 50 MG tablet Commonly known as:  ZOLOFT Take 50 mg by mouth daily.    spironolactone 50 MG tablet Commonly known as:  ALDACTONE Take 50 mg by mouth daily.   traZODone 50 MG tablet Commonly known as:  DESYREL Take 50 mg by mouth at bedtime.   vitamin C 250 MG tablet Commonly known as:  ASCORBIC ACID Take 250 mg by mouth 2 (two) times daily.   Vitamin D (Ergocalciferol) 50000 units Caps capsule Commonly known as:  DRISDOL Take 50,000 Units by mouth once a week. On Wednesday       No orders of the defined types were placed in this encounter.   Immunization History  Administered Date(s) Administered  . Influenza, High Dose  Seasonal PF 10/05/2016, 09/15/2017  . Influenza, Seasonal, Injecte, Preservative Fre 10/17/2015  . Influenza-Unspecified 10/17/2015, 10/05/2016, 08/20/2017  . Pneumococcal Conjugate-13 11/24/2017, 11/24/2017  . Pneumococcal Polysaccharide-23 06/17/2016, 06/17/2016  . Pneumococcal-Unspecified 06/17/2016    Social History   Tobacco Use  . Smoking status: Former Smoker    Last attempt to quit: 07/12/2005    Years since quitting: 12.6  . Smokeless tobacco: Never Used  Substance Use Topics  . Alcohol use: No    Frequency: Never    Comment: Alcohol use years ago; patient was also diagnosed with cirrhosis years ago    Family history is   Family History  Problem Relation Age of Onset  . Stomach cancer Mother   . Stroke Father   . Cancer Sister       Review of Systems  DATA OBTAINED: from patient, nurse GENERAL:  no fevers, fatigue, appetite changes SKIN: No itching, or rash EYES: No eye pain, redness, discharge EARS: No earache, tinnitus, change in hearing NOSE: No congestion, drainage or bleeding  MOUTH/THROAT: No mouth or tooth pain, No sore throat RESPIRATORY: No cough, wheezing, +SOB CARDIAC: No chest pain, palpitations, lower extremity edema  GI: No abdominal pain, No N/V/D or constipation, No heartburn or reflux  GU: No dysuria, frequency or urgency, or incontinence  MUSCULOSKELETAL: No unrelieved  bone/joint pain NEUROLOGIC: No headache, dizziness or focal weakness PSYCHIATRIC: No c/o anxiety or sadness   Vitals:   03/13/18 0819  BP: 119/68  Pulse: 70  Resp: 18  Temp: 98.4 F (36.9 C)  SpO2: 95%    SpO2 Readings from Last 1 Encounters:  03/13/18 95%   Body mass index is 33.66 kg/m.     Physical Exam  GENERAL APPEARANCE: Alert, conversant, appears with some shortness of breath SKIN: No diaphoresis rash HEAD: Normocephalic, atraumatic  EYES: Conjunctiva/lids clear. Pupils round, reactive. EOMs intact.  EARS: External exam WNL, canals clear. Hearing grossly normal.  NOSE: No deformity or discharge.  MOUTH/THROAT: Lips w/o lesions  RESPIRATORY: Breathing is even, unlabored. Lung sounds are clear   CARDIOVASCULAR: Heart RRR no murmurs, rubs or gallops. No peripheral edema.   GASTROINTESTINAL: Abdomen is soft, non-tender, large with ascites w/ normal bowel sounds. GENITOURINARY: Bladder non tender, not distended  MUSCULOSKELETAL: No abnormal joints or musculature NEUROLOGIC:  Cranial nerves 2-12 grossly intact. Moves all extremities  PSYCHIATRIC: Mood and affect appropriate to situation with dementia, no behavioral issues  Patient Active Problem List   Diagnosis Date Noted  . MSSA bacteremia 02/22/2018  . 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 Fall 02/22/2018  . Rhabdomyolysis 02/22/2018  . Hypomagnesemia 02/22/2018  . Hypokalemia 02/22/2018  . Hyponatremia 02/22/2018  . Hypophosphatemia 02/22/2018  . Knee pain, right 02/22/2018  . Cirrhosis of liver with ascites (HCC) 02/22/2018  . Thrombocytopenia (HCC) 02/22/2018  . Metabolic encephalopathy 02/22/2018  . COPD exacerbation (HCC) 02/22/2018  . Ventricular tachycardia, nonsustained (HCC) 02/22/2018  . Diabetes mellitus (HCC) 02/22/2018  . Hyperlipidemia associated with type 2 diabetes mellitus (HCC) 02/22/2018  . Dementia 02/22/2018  . Depressed 02/22/2018      Labs reviewed: Basic Metabolic Panel:     Component Value Date/Time   NA 141 03/05/2018   K 3.9 03/05/2018   BUN 15 03/05/2018   CREATININE 0.7 03/05/2018   AST 48 (A) 03/05/2018   ALT 9 (A) 03/05/2018   ALKPHOS 120 03/05/2018    Recent Labs    02/17/18 03/05/18  NA 136* 141  K 4.2 3.9  BUN 22* 15  CREATININE 0.8  0.7   Liver Function Tests: Recent Labs    02/09/18 03/05/18  AST 35 48*  ALT 26 9*  ALKPHOS 77 120   No results for input(s): LIPASE, AMYLASE in the last 8760 hours. No results for input(s): AMMONIA in the last 8760 hours. CBC: Recent Labs    02/17/18 03/05/18  WBC 6.8 6.7  HGB 10.1* 8.8*  HCT 30* 26*  PLT 151 96*   Lipid No results for input(s): CHOL, HDL, LDLCALC, TRIG in the last 8760 hours.  Cardiac Enzymes: No results for input(s): CKTOTAL, CKMB, CKMBINDEX, TROPONINI in the last 8760 hours. BNP: No results for input(s): BNP in the last 8760 hours. No results found for: MICROALBUR No results found for: HGBA1C No results found for: TSH No results found for: VITAMINB12 No results found for: FOLATE No results found for: IRON, TIBC, FERRITIN  Imaging and Procedures obtained prior to SNF admission: Ct Lumbar Spine Wo Contrast  Result Date: 02/07/2007 Clinical Data:  Post-L5-S1 fusion 07/28/06 with low back pain.  LUMBAR SPINE CT WITHOUT CONTRAST: Technique:  Multidetector CT imaging of the lumbar spine was performed.  Multiplanar CT image reconstructions were also generated. Comparison:  [facility] intraoperative lumbar spine radiographs 07/28/06. Findings:  T12-L1:  The disc appears normal with no neural impingement. L1-2:  Slight diffuse annular disc bulge is seen with no direct nerve root impingement nor significant central canal stenosis.  L2-3 and L3-4:  Slight to moderate diffuse annular disc bulge with congenitally short pedicles demonstrates borderline to slight central canal stenosis with no direct nerve root impingement seen. L4-5:  Moderate diffuse annular disc bulge is seen with posterior  ligamenta flava silhouetted by posterior laminectomy post operative epidural fibrosis.  Again borderline to slight central canal stenosis is seen with no direct nerve root impingement. L5-S1:  Posterior laminectomy with transpedicular screws noted with screws well placed with no surrounding osteolysis/loosening.  Interbody bone plug appears well placed with complete fusion at the L-5 end plate and near complete fusion at the S-1 end plate. Posterior vertebral alignment is normally maintained.  No new osseous lesions are seen.  Slight to moderate vascular calcification is noted with normal sized abdominal aorta. IMPRESSION: 1.  Postlaminectomy fusion changes L5-S1 appears satisfactory without complicating feature.  2.  Borderline to slight central canal stenosis due to congential and acquired factors from L2-3 through L4-5 most marked at L4-5. 3.  Slight disc bulges L2-3 through L4-5. 4.  Slight to moderate atheromatous vascular calcification with normal sized abdominal aorta. Provider: Lanice Shirts    Not all labs, radiology exams or other studies done during hospitalization come through on my EPIC note; however they are reviewed by me.    Assessment and Plan  Cirrhosis of liver with ascites- patient was guided paracentesis on 3/18 with removal of 6 L of clear yellow fluid; repeat paracentesis attempted on 3/19 but revealed insufficient ascitic fluid/pockets to drain SNF -admitted for OT/PT and continued IV antibiotics for epidural abscesses; patient looks like he needs to undergo paracentesis again soon; he looks like he did before his last admission but just not as bad; working to get him in for paracentesis end of the week or next week; continue Lasix 40 mg daily and spironolactone 50 mg daily  Epidural abscesses L3-L4 SNF -continue cefazolin 2 g IV every 8 hours until 05/04/2018 with weekly CBC CMP sed rate and C-reactive protein to be faxed to Dr. Trisha Mangle; repeat MRI of lumbar spine 4 weeks from  discharge making it around  April 1  Diastolic congestive heart failure-euvolemic SNF -continue metoprolol 12.5 mg twice daily, spironolactone 50 mg daily and Lasix 40 mg daily  Dementia SNF -stable; continue Aricept 5 mg daily  Mood disorder SNF -appears stable; continue Seroquel 50 mg nightly  Time spent greater than 35 minutes;> 50% of time with patient was spent reviewing records, labs, tests and studies, counseling and developing plan of care  Thurston Hole D. Lyn Hollingshead, MD

## 2018-03-15 ENCOUNTER — Encounter: Payer: Self-pay | Admitting: *Deleted

## 2018-03-15 LAB — COMPLETE METABOLIC PANEL WITH GFR
ALT: 5
AST: 26
Albumin: 3.6
Alkaline Phosphatase: 92
BILIRUBIN TOTAL: 1.3
BUN: 17 (ref 4–21)
CREATININE: 0.74
Calcium: 8.9
Glucose: 81
Potassium: 4.2
Sodium: 144
Total Protein: 6 g/dL

## 2018-03-15 LAB — CBC
HCT: 24.2
HEMOGLOBIN: 7.7
WBC: 4.9
platelet count: 81

## 2018-03-15 LAB — C-REACTIVE PROTEIN: CRP: 1.3

## 2018-03-15 LAB — SEDIMENTATION RATE: SED RATE BY MODIFIED WESTERGREN,MANUAL: 8

## 2018-03-18 ENCOUNTER — Encounter: Payer: Self-pay | Admitting: Internal Medicine

## 2018-03-18 DIAGNOSIS — I509 Heart failure, unspecified: Secondary | ICD-10-CM | POA: Insufficient documentation

## 2018-03-18 DIAGNOSIS — F39 Unspecified mood [affective] disorder: Secondary | ICD-10-CM | POA: Insufficient documentation

## 2018-03-30 ENCOUNTER — Non-Acute Institutional Stay (SKILLED_NURSING_FACILITY): Payer: Medicare HMO | Admitting: Internal Medicine

## 2018-03-30 ENCOUNTER — Encounter: Payer: Self-pay | Admitting: Internal Medicine

## 2018-03-30 DIAGNOSIS — G062 Extradural and subdural abscess, unspecified: Secondary | ICD-10-CM

## 2018-03-30 DIAGNOSIS — R188 Other ascites: Secondary | ICD-10-CM

## 2018-03-30 DIAGNOSIS — I5032 Chronic diastolic (congestive) heart failure: Secondary | ICD-10-CM

## 2018-03-30 DIAGNOSIS — K7460 Unspecified cirrhosis of liver: Secondary | ICD-10-CM

## 2018-03-30 NOTE — Progress Notes (Signed)
Location:   [facility] Room Number: 317/P Place of Service:  SNF [phone]) Provider:  Juanetta Beets, MD  Patient Care Team: Margit Hanks, MD as PCP - General (Internal Medicine)  Extended Emergency Contact Information Primary Emergency Contact: Connon,Homer Home Phone: [phone] Relation: Brother  Code Status:  Full Code Goals of care: Advanced Directive information Advanced Directives 03/30/2018  Does Patient Have a Medical Advance Directive? Yes  Type of Advance Directive (No Data)  Does patient want to make changes to medical advance directive? No - Patient declined  Would patient like information on creating a medical advance directive? No - Patient declined    Chief complaint-acute visit to follow-up ascites and lower extremity edema   HPI:  Pt is a 67 y.o. male seen today for an acute visit for follow-up of ascites with lower extremity edema Patient has a history of cirrhosis of the liver with portal hypertension as well as ascites.--He also has a history of diastolic CHF- he is being treated with metoprolol-he is also on spironolactone and Lasix.    He is also on IV antibiotics with history of epidural abscess- he continues on cefazolin until May 04, 2018.  In regards to ascites he had a paracentesis done on March 18 when 6 L of clear fluid was obtained   The paracentesis also was attempted on March 19 but that revealed insufficient-fluid to drain  He did receive a paracentesis approximately 2 weeks ago unclear how much fluid was obtained but apparently this did help.  Is noted today's weight is 204 pounds which is up about 8 pounds over the past week.    --.    Past Medical History:  Diagnosis Date  . AKI (acute kidney injury) (HCC) 12/25/2017  . CHF (congestive heart failure) (HCC)    diastolic  . Cirrhosis of liver with ascites (HCC)   . COPD (chronic obstructive pulmonary disease) (HCC)   . Dementia    . Depressed   . Diabetes mellitus (HCC)   . Diabetic neuropathy (HCC)   . Edema   . GERD (gastroesophageal reflux disease)   . GI bleed   . Gynecomastia, male 06/2017  . Hyperammonemia (HCC)   . Hyperlipidemia   . Hypertension   . Kidney stones   . Liver cirrhosis (HCC)   . Major depression   . Metabolic encephalopathy   . RA (rheumatoid arthritis) (HCC)   . Restless leg syndrome    Past Surgical History:  Procedure Laterality Date  . BACK SURGERY    . COLONOSCOPY  02/13/2018   Procedure: Colonoscopy; Surgeon: Chaney Born, MD; Location: [facility] OR; Service: Gastroenterology; Laterality: N/A;  . ESOPHAGOSCOPY  02/13/2018   Procedure: EGD; Surgeon: Chaney Born, MD; Location: [facility] OR; Service: Gastroenterology; Laterality: N/A;  . EYE SURGERY    . HEMORRHOID SURGERY    . LUMBAR FUSION    . ROTATOR CUFF REPAIR Bilateral   . UPPER GI ENDOSCOPY Left 04/17/2016   Procedure: UGI ENDO, INCLUDE ESOPHAGUS, STOMACH, & DUODENUM &/OR JEJUNUM; DX W/WO COLLECTION SPECIMN,; Surgeon: Stasia Cavalier, MD; Location: [facility]; Service: Gastroenterology    Allergies  Allergen Reactions  . Ace Inhibitors Swelling    Other reaction(s): Unknown  . Latex Rash    "Latex Tape" per patient "Latex Tape" per patient  "Latex Tape" per patient  . Morphine Nausea And Vomiting    Other reaction(s): Unknown    Outpatient Encounter Medications  as of 03/30/2018  Medication Sig  . acetaminophen (TYLENOL) 325 MG tablet Take 650 mg by mouth every 6 (six) hours as needed.  Marland Kitchen albuterol (PROVENTIL HFA;VENTOLIN HFA) 108 (90 Base) MCG/ACT inhaler Inhale 2 puffs into the lungs every 4 (four) hours as needed.  Marland Kitchen aspirin 81 MG chewable tablet Chew 81 mg by mouth daily.  Marland Kitchen atorvastatin (LIPITOR) 20 MG tablet Take 20 mg by mouth daily.  Marland Kitchen ceFAZolin (ANCEF) 2-3 GM-%(50ML) SOLR Inject 2 g into the vein every 8 (eight) hours.  Marland Kitchen donepezil (ARICEPT) 5 MG tablet Take 5 mg by  mouth at bedtime.  . ferrous sulfate 325 (65 FE) MG tablet Take 325 mg by mouth 2 (two) times daily with a meal.  . fluticasone-salmeterol (ADVAIR HFA) 115-21 MCG/ACT inhaler Inhale 2 puffs into the lungs daily.  . folic acid (FOLVITE) 1 MG tablet Take 1 mg by mouth daily.  . furosemide (LASIX) 20 MG tablet Take 40 mg by mouth daily. 2 tabs  . lidocaine (LIDODERM) 5 % Place 1 patch onto the skin daily. Remove & Discard patch within 12 hours or as directed by MD. Apply one patch to lower back in the am.  . magnesium oxide (MAG-OX) 400 MG tablet Take 400 mg by mouth 2 (two) times daily.  . metFORMIN (GLUCOPHAGE) 500 MG tablet Take 500 mg by mouth 2 (two) times daily with a meal.   . metoprolol tartrate (LOPRESSOR) 25 MG tablet Take 12.5 mg by mouth 2 (two) times daily.  . mirtazapine (REMERON) 30 MG tablet Take 30 mg by mouth at bedtime.  . nitroGLYCERIN (NITROSTAT) 0.4 MG SL tablet Place 0.4 mg under the tongue as needed. Every 5 minutes as needed for chest pain. Call Dr/911 if take 2 doses. Max 3/day  . ondansetron (ZOFRAN) 4 MG tablet Take 4 mg by mouth as needed. Every 6 - 8 hours  . pantoprazole (PROTONIX) 40 MG tablet Take 40 mg by mouth daily.  . potassium chloride SA (K-DUR,KLOR-CON) 20 MEQ tablet Take 20 mEq by mouth daily.  . pramipexole (MIRAPEX) 1.5 MG tablet Take 1.5 mg by mouth every evening.  . pregabalin (LYRICA) 50 MG capsule Take 50 mg by mouth 3 (three) times daily.  . QUEtiapine (SEROQUEL) 50 MG tablet Take 50 mg by mouth at bedtime.  . sertraline (ZOLOFT) 50 MG tablet Take 50 mg by mouth daily.  Marland Kitchen spironolactone (ALDACTONE) 50 MG tablet Take 50 mg by mouth daily.  . traZODone (DESYREL) 50 MG tablet Take 50 mg by mouth at bedtime.  . Vitamin D, Ergocalciferol, (DRISDOL) 50000 units CAPS capsule Take 50,000 Units by mouth once a week. On Wednesday  . pregabalin (LYRICA) 25 MG capsule Take 50 mg by mouth 3 (three) times daily. 2 caps  . [DISCONTINUED] lidocaine (XYLOCAINE) 2 %  jelly Apply 1 application topically 2 (two) times daily.  . [DISCONTINUED] metoprolol tartrate (LOPRESSOR) 25 MG tablet Take 12.5 mg by mouth 2 (two) times daily.   . [DISCONTINUED] oxycodone (OXY-IR) 5 MG capsule Take 5 mg by mouth every 4 (four) hours as needed.    No facility-administered encounter medications on file as of 03/30/2018.     Review of Systems   In general is not complaining of any fever chills.  Skin does not complain of rashes or itching.  Eyes does not complain of visual changes or sore throat.  Respiratory does not complain of increased shortness of breath beyond baseline says he gets short of breath with exertion but this  is not new.  Cardiac is not complaining of chest pain has significant lower extremity edema.  GI does not complain of abdominal discomfort-does have significant ascites does not complain of nausea or vomiting constipation or diarrhea.  GU is not complaining of dysuria.  Musculoskeletal has weakness but does not complain of joint pain at this time apparently does have back discomfort at times.  Neurologic does not complain of dizziness headache or numbness.  Psych does have a history of dementia but is not complain of being anxious or depressed currently.    Immunization History  Administered Date(s) Administered  . Influenza, High Dose Seasonal PF 10/05/2016, 09/15/2017  . Influenza, Seasonal, Injecte, Preservative Fre 10/17/2015  . Influenza-Unspecified 10/17/2015, 10/05/2016, 08/20/2017  . Pneumococcal Conjugate-13 11/24/2017, 11/24/2017  . Pneumococcal Polysaccharide-23 06/17/2016, 06/17/2016  . Pneumococcal-Unspecified 06/17/2016   Pertinent  Health Maintenance Due  Topic Date Due  . FOOT EXAM  04/28/2018 (Originally 09/21/1961)  . HEMOGLOBIN A1C  04/28/2018 (Originally [birth year])  . OPHTHALMOLOGY EXAM  04/28/2018 (Originally 09/21/1961)  . URINE MICROALBUMIN  04/28/2018 (Originally 09/21/1961)  . COLONOSCOPY  04/28/2018  (Originally 09/21/2001)  . INFLUENZA VACCINE  07/20/2018  . PNA vac Low Risk Adult (2 of 2 - PPSV23) 06/17/2021   No flowsheet data found. Functional Status Survey:    Vitals:   03/30/18 1027  BP: 122/68  Pulse: 66  Resp: 18  Temp: (!) 97.1 F (36.2 C)  TempSrc: Oral  SpO2: 94%   Weight is 204 pounds Physical Exam  In general this is a pleasant elderly male in no distress .  His skin is warm and dry.  Eyes visual acuity appears grossly intact.  Oropharynx is clear mucous membranes moist.  Chest he has minimal intermittent expiratory wheezing but this is not consistent and largely cleared on reevaluation.  Heart is regular rate and rhythm without murmur gallop or rub he has 2++ lower extremity edema   Heart is regular rate and rhythm without murmur gallop or rub--he has 2+ lower extremity edema  Abdomen is protuberant it is soft nontender with positive bowel sounds-he appears to have significant ascites.  Musculoskeletal does move all extremities x4 limited exam since he is in bed but I do not note any acute changes it appears from his baseline.  Neurologic is grossly intact his speech is clear no lateralizing findings he moves all extremities.  Psych he is oriented to situation with some mild cognitive deficits he is not anxious he is pleasant and cooperative   Labs reviewed:   March 29, 2018.  Creatinine 0.79 BUN 21.9 sodium 143 potassium 3.7.  WBC 3.7 hemoglobin 7.6 platelets 63,000.   Recent Labs    02/17/18 03/05/18 03/15/18  NA 136* 141 144  K 4.2 3.9 4.2  BUN 22* 15 17  CREATININE 0.8 0.7 0.74  CALCIUM  --   --  8.9   Recent Labs    02/09/18 03/05/18 03/15/18  AST 35 48* 26  ALT 26 9* 5  ALKPHOS 77 120 92  BILITOT  --   --  1.3  PROT  --   --  6  ALBUMIN  --   --  3.6   Recent Labs    02/17/18 03/05/18 03/15/18  WBC 6.8 6.7 4.9  HGB 10.1* 8.8* 7.7  HCT 30* 26* 24.2  PLT 151 96*  --    No results found for: TSH No results found for:  HGBA1C No results found for: CHOL, HDL, LDLCALC, LDLDIRECT, TRIG, CHOLHDL  Significant  Diagnostic Results in last 30 days:  No results found.  Assessment/Plan   #1-cirrhosis of liver with ascites-patient does have significant abdominal distention and ascites it appears clinically he does not appear to be in any distress O2 sat is satisfactory in the mid 90s- he is scheduled to get an MRI of the spine today nursing staff is calling him to see if he can get a paracentesis today as well if not certainly as soon as possible.  Monitor with vital signs pulse ox every shift-also daily weights notify provider if gain greater than 3 pounds.  2.  History of diastolic CHF- he is on Lopressor he is also on spironolactone 50 mg a day Lasix 40 mg a day-secondary to weight gain and increased edema will increase Lasix up to 40 mg twice daily for a couple days and and then start Lasix 40 mg in the morning and 20 mg at night.  He is currently on potassium 20 mEq a day secondary to increase in Lasix will increase slightly to 30 mEq a day and will need updated labs as ordered-next week  it appears there is a standing order for this from infectious disease also monitor weights daily for now notify provider if gain greater than 3 pounds-  Monitor vital signs pulse ox every shift air is  3.---Epidural abscess L3-L4-continues on IV ceftazoline until May 16- has weekly CBC CMP and sed rate and C-reactive protein that is faxed to Dr. Wilburn Corneliariplett-he is receiving an MRI of the lumbar spine today.  4.  History of dementia this appears fairly stable on Aricept low-dose.  5.  History COPD he continues on Advair he has minimal wheezing intermittent today I suspect this is COPD related does not complain of shortness of breath beyond baseline.  [id number] .

## 2018-04-09 ENCOUNTER — Other Ambulatory Visit: Payer: Self-pay

## 2018-04-09 ENCOUNTER — Encounter (HOSPITAL_BASED_OUTPATIENT_CLINIC_OR_DEPARTMENT_OTHER): Payer: Self-pay | Admitting: Emergency Medicine

## 2018-04-09 ENCOUNTER — Emergency Department (HOSPITAL_BASED_OUTPATIENT_CLINIC_OR_DEPARTMENT_OTHER): Payer: Medicare HMO

## 2018-04-09 ENCOUNTER — Emergency Department (HOSPITAL_BASED_OUTPATIENT_CLINIC_OR_DEPARTMENT_OTHER)
Admission: EM | Admit: 2018-04-09 | Discharge: 2018-04-09 | Disposition: A | Discharge status: Rehabilitation Facility | Payer: Medicare HMO | Attending: Emergency Medicine | Admitting: Emergency Medicine

## 2018-04-09 DIAGNOSIS — I509 Heart failure, unspecified: Secondary | ICD-10-CM | POA: Insufficient documentation

## 2018-04-09 DIAGNOSIS — Y92199 Unspecified place in other specified residential institution as the place of occurrence of the external cause: Secondary | ICD-10-CM | POA: Insufficient documentation

## 2018-04-09 DIAGNOSIS — W1789XA Other fall from one level to another, initial encounter: Secondary | ICD-10-CM | POA: Insufficient documentation

## 2018-04-09 DIAGNOSIS — W19XXXA Unspecified fall, initial encounter: Secondary | ICD-10-CM

## 2018-04-09 DIAGNOSIS — S40012A Contusion of left shoulder, initial encounter: Secondary | ICD-10-CM | POA: Insufficient documentation

## 2018-04-09 DIAGNOSIS — Z7982 Long term (current) use of aspirin: Secondary | ICD-10-CM | POA: Diagnosis not present

## 2018-04-09 DIAGNOSIS — Y939 Activity, unspecified: Secondary | ICD-10-CM | POA: Diagnosis not present

## 2018-04-09 DIAGNOSIS — J449 Chronic obstructive pulmonary disease, unspecified: Secondary | ICD-10-CM | POA: Diagnosis not present

## 2018-04-09 DIAGNOSIS — E119 Type 2 diabetes mellitus without complications: Secondary | ICD-10-CM | POA: Diagnosis not present

## 2018-04-09 DIAGNOSIS — I110 Hypertensive heart disease with heart failure: Secondary | ICD-10-CM | POA: Diagnosis not present

## 2018-04-09 DIAGNOSIS — F0390 Unspecified dementia without behavioral disturbance: Secondary | ICD-10-CM | POA: Insufficient documentation

## 2018-04-09 DIAGNOSIS — S4992XA Unspecified injury of left shoulder and upper arm, initial encounter: Secondary | ICD-10-CM | POA: Diagnosis present

## 2018-04-09 DIAGNOSIS — Y999 Unspecified external cause status: Secondary | ICD-10-CM | POA: Insufficient documentation

## 2018-04-09 DIAGNOSIS — Z79899 Other long term (current) drug therapy: Secondary | ICD-10-CM | POA: Insufficient documentation

## 2018-04-09 NOTE — ED Notes (Signed)
[facility] called for transport.  

## 2018-04-09 NOTE — ED Provider Notes (Signed)
[facility] EMERGENCY DEPARTMENT Provider Note   CSN: [id number] Arrival date & time: 04/09/18  1025     History   Chief Complaint Chief Complaint  Patient presents with  . Fall    HPI Jacob Hawkins is a 67 y.o. male.  67yo M w/ PMH including cirrhosis, COPD, CHF, HTN, RA, T2DM who p/w fall. PT was at his nursing facility today and just PTA had a fall from standing. He reports hitting L shoulder. He does not think he lost consciousness, does not recall a head injury. He reports constant, moderate to severe L upper arm and shoulder pain.  He denies any other new areas of pain.  He reports persistent swelling of lower legs and abdomen from fluid retention.  He recently had a paracentesis.  The history is provided by the patient.    Past Medical History:  Diagnosis Date  . AKI (acute kidney injury) (HCC) 12/25/2017  . CHF (congestive heart failure) (HCC)    diastolic  . Cirrhosis of liver with ascites (HCC)   . COPD (chronic obstructive pulmonary disease) (HCC)   . Dementia   . Depressed   . Diabetes mellitus (HCC)   . Diabetic neuropathy (HCC)   . Edema   . GERD (gastroesophageal reflux disease)   . GI bleed   . Gynecomastia, male 06/2017  . Hyperammonemia (HCC)   . Hyperlipidemia   . Hypertension   . Kidney stones   . Liver cirrhosis (HCC)   . Major depression   . Metabolic encephalopathy   . RA (rheumatoid arthritis) (HCC)   . Restless leg syndrome     Patient Active Problem List   Diagnosis Date Noted  . CHF (congestive heart failure) (HCC) 03/18/2018  . Mood disorder (HCC) 03/18/2018  . MSSA bacteremia 02/22/2018  . 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 Fall 02/22/2018  . Rhabdomyolysis 02/22/2018  . Hypomagnesemia 02/22/2018  . Hypokalemia 02/22/2018  . Hyponatremia 02/22/2018  . Hypophosphatemia 02/22/2018  . Knee pain, right 02/22/2018  . Cirrhosis of liver with ascites (HCC) 02/22/2018  . Thrombocytopenia (HCC) 02/22/2018  . Metabolic  encephalopathy 02/22/2018  . COPD exacerbation (HCC) 02/22/2018  . Ventricular tachycardia, nonsustained (HCC) 02/22/2018  . Diabetes mellitus (HCC) 02/22/2018  . Hyperlipidemia associated with type 2 diabetes mellitus (HCC) 02/22/2018  . Dementia 02/22/2018  . Depressed 02/22/2018    Past Surgical History:  Procedure Laterality Date  . BACK SURGERY    . COLONOSCOPY  02/13/2018   Procedure: Colonoscopy; Surgeon: Chaney Born, MD; Location: [facility] OR; Service: Gastroenterology; Laterality: N/A;  . ESOPHAGOSCOPY  02/13/2018   Procedure: EGD; Surgeon: Chaney Born, MD; Location: [facility] OR; Service: Gastroenterology; Laterality: N/A;  . EYE SURGERY    . HEMORRHOID SURGERY    . LUMBAR FUSION    . ROTATOR CUFF REPAIR Bilateral   . UPPER GI ENDOSCOPY Left 04/17/2016   Procedure: UGI ENDO, INCLUDE ESOPHAGUS, STOMACH, & DUODENUM &/OR JEJUNUM; DX W/WO COLLECTION SPECIMN,; Surgeon: Stasia Cavalier, MD; Location: Endo Procedures [facility]; Service: Gastroenterology        Home Medications    Prior to Admission medications   Medication Sig Start Date End Date Taking? Authorizing Provider  acetaminophen (TYLENOL) 325 MG tablet Take 650 mg by mouth every 6 (six) hours as needed.   Yes [provider]  albuterol (PROVENTIL HFA;VENTOLIN HFA) 108 (90 Base) MCG/ACT inhaler Inhale 2 puffs into the lungs every 4 (four) hours as needed.   Yes [provider]  aspirin 81 MG chewable tablet Chew 81 mg by mouth daily. 01/02/18  Yes [provider]  atorvastatin (LIPITOR) 20 MG tablet Take 20 mg by mouth daily.   Yes [provider]  ceFAZolin (ANCEF) 2-3 GM-%(50ML) SOLR Inject 2 g into the vein every 8 (eight) hours. 02/17/18  Yes [provider]  donepezil (ARICEPT) 5 MG tablet Take 5 mg by mouth at bedtime. 10/05/17  Yes [provider]  ferrous sulfate 325 (65 FE) MG tablet Take 325 mg by mouth 2 (two) times daily with a meal.    Yes [provider]  fluticasone-salmeterol (ADVAIR HFA) 115-21 MCG/ACT inhaler Inhale 2 puffs into the lungs daily. 10/05/17  Yes [provider]  folic acid (FOLVITE) 1 MG tablet Take 1 mg by mouth daily.   Yes [provider]  furosemide (LASIX) 20 MG tablet Take 40 mg by mouth daily. 2 tabs   Yes [provider]  lidocaine (LIDODERM) 5 % Place 1 patch onto the skin daily. Remove & Discard patch within 12 hours or as directed by MD. Apply one patch to lower back in the am.   Yes [provider]  magnesium oxide (MAG-OX) 400 MG tablet Take 400 mg by mouth 2 (two) times daily.   Yes [provider]  metFORMIN (GLUCOPHAGE) 500 MG tablet Take 500 mg by mouth 2 (two) times daily with a meal.    Yes [provider]  metoprolol tartrate (LOPRESSOR) 25 MG tablet Take 12.5 mg by mouth 2 (two) times daily.   Yes [provider]  mirtazapine (REMERON) 30 MG tablet Take 30 mg by mouth at bedtime. 12/28/17  Yes [provider]  nitroGLYCERIN (NITROSTAT) 0.4 MG SL tablet Place 0.4 mg under the tongue as needed. Every 5 minutes as needed for chest pain. Call Dr/911 if take 2 doses. Max 3/day   Yes [provider]  ondansetron (ZOFRAN) 4 MG tablet Take 4 mg by mouth as needed. Every 6 - 8 hours   Yes [provider]  oxyCODONE-acetaminophen (PERCOCET/ROXICET) 5-325 MG tablet Take by mouth every 4 (four) hours as needed for severe pain.   Yes [provider]  pantoprazole (PROTONIX) 40 MG tablet Take 40 mg by mouth daily.   Yes [provider]  potassium chloride SA (K-DUR,KLOR-CON) 20 MEQ tablet Take 20 mEq by mouth daily. 05/04/17  Yes [provider]  pramipexole (MIRAPEX) 1.5 MG tablet Take 1.5 mg by mouth every evening.   Yes [provider]  pregabalin (LYRICA) 25 MG capsule Take 50 mg by mouth 3 (three) times daily. 2 caps 12/31/17 04/09/18 Yes [provider]    pregabalin (LYRICA) 50 MG capsule Take 50 mg by mouth 3 (three) times daily.   Yes [provider]  QUEtiapine (SEROQUEL) 50 MG tablet Take 50 mg by mouth at bedtime. 12/28/17  Yes [provider]  sertraline (ZOLOFT) 50 MG tablet Take 50 mg by mouth daily. 12/28/17  Yes [provider]  spironolactone (ALDACTONE) 50 MG tablet Take 50 mg by mouth daily. 05/04/17  Yes [provider]  Vitamin D, Ergocalciferol, (DRISDOL) 50000 units CAPS capsule Take 50,000 Units by mouth once a week. On Wednesday   Yes [provider]  traZODone (DESYREL) 50 MG tablet Take 50 mg by mouth at bedtime. 12/31/17   [provider]    Family History Family History  Problem Relation Age of Onset  . Stomach cancer Mother   . Stroke Father   .  Cancer Sister     Social History Social History   Tobacco Use  . Smoking status: Former Smoker    Last attempt to quit: 07/12/2005    Years since quitting: 12.7  . Smokeless tobacco: Never Used  Substance Use Topics  . Alcohol use: Not Currently    Frequency: Never    Comment: Alcohol use years ago; patient was also diagnosed with cirrhosis years ago  . Drug use: No     Allergies   Ace inhibitors; Latex; and Morphine   Review of Systems Review of Systems All other systems reviewed and are negative except that which was mentioned in HPI   Physical Exam Updated Vital Signs BP 116/60 (BP Location: Left Arm)   Pulse 80   Temp 98.4 F (36.9 C) (Oral)   Resp 20   Ht 5\' 3"  (1.6 m)   Wt 94.8 kg (209 lb)   SpO2 95%   BMI 37.02 kg/m   Physical Exam  Constitutional: He is oriented to person, place, and time. He appears well-developed and well-nourished. No distress.  Chronically ill appearing  HENT:  Head: Normocephalic and atraumatic.  Moist mucous membranes  Eyes: Pupils are equal, round, and reactive to light. Conjunctivae are normal.  Neck:  In soft collar  Cardiovascular: Normal rate, regular  rhythm and normal heart sounds.  No murmur heard. Pulmonary/Chest: Breath sounds normal. No respiratory distress. He has no wheezes.  Mild dyspnea w/ increased WOB  Abdominal: Soft. Bowel sounds are normal. He exhibits distension. There is no tenderness.  Musculoskeletal: He exhibits edema and tenderness. He exhibits no deformity.  3+ pitting edema BLE to knees; tenderness of L proximal humerus and anterior shoulder without obvious deformity; clavicles symmetric and non-tender  Neurological: He is alert and oriented to person, place, and time.  Fluent speech  Skin: Skin is warm and dry.  Scattered old ecchymoses b/l arms and hands  Psychiatric: He has a normal mood and affect.  Nursing note and vitals reviewed.    ED Treatments / Results  Labs (all labs ordered are listed, but only abnormal results are displayed) Labs Reviewed - No data to display  EKG None  Radiology Ct Head Wo Contrast  Result Date: 04/09/2018 CLINICAL DATA:  Status post fall today. Headache. Initial encounter. EXAM: CT HEAD WITHOUT CONTRAST CT CERVICAL SPINE WITHOUT CONTRAST TECHNIQUE: Multidetector CT imaging of the head and cervical spine was performed following the standard protocol without intravenous contrast. Multiplanar CT image reconstructions of the cervical spine were also generated. COMPARISON:  Head CT scan 10/05/2017. FINDINGS: CT HEAD FINDINGS Brain: No evidence of acute infarction, hemorrhage, hydrocephalus, extra-axial collection or mass lesion/mass effect. Vascular: Atherosclerosis noted. Skull: Intact. Sinuses/Orbits: Status post lens extraction.  Otherwise negative. Other: None. CT CERVICAL SPINE FINDINGS Alignment: Maintained.  Mild reversal of lordosis noted. Skull base and vertebrae: No acute fracture. No primary bone lesion or focal pathologic process. Soft tissues and spinal canal: No prevertebral fluid or swelling. No visible canal hematoma. Disc levels: Loss of disc space height appears worst  at C4-5, C5-6 and C6-7. Upper chest: Extensive emphysema is present.  No acute abnormality. Other: None. IMPRESSION: No acute abnormality head or cervical spine. Atherosclerosis. Degenerative disc disease C4-C7. Emphysema. Electronically Signed   By: Drusilla Kannerhomas  Dalessio M.D.   On: 04/09/2018 12:51   Ct Cervical Spine Wo Contrast  Result Date: 04/09/2018 CLINICAL DATA:  Status post fall today. Headache. Initial encounter. EXAM: CT HEAD WITHOUT CONTRAST CT CERVICAL SPINE WITHOUT CONTRAST TECHNIQUE: Multidetector  CT imaging of the head and cervical spine was performed following the standard protocol without intravenous contrast. Multiplanar CT image reconstructions of the cervical spine were also generated. COMPARISON:  Head CT scan 10/05/2017. FINDINGS: CT HEAD FINDINGS Brain: No evidence of acute infarction, hemorrhage, hydrocephalus, extra-axial collection or mass lesion/mass effect. Vascular: Atherosclerosis noted. Skull: Intact. Sinuses/Orbits: Status post lens extraction.  Otherwise negative. Other: None. CT CERVICAL SPINE FINDINGS Alignment: Maintained.  Mild reversal of lordosis noted. Skull base and vertebrae: No acute fracture. No primary bone lesion or focal pathologic process. Soft tissues and spinal canal: No prevertebral fluid or swelling. No visible canal hematoma. Disc levels: Loss of disc space height appears worst at C4-5, C5-6 and C6-7. Upper chest: Extensive emphysema is present.  No acute abnormality. Other: None. IMPRESSION: No acute abnormality head or cervical spine. Atherosclerosis. Degenerative disc disease C4-C7. Emphysema. Electronically Signed   By: Drusilla Kanner M.D.   On: 04/09/2018 12:51   Dg Shoulder Left  Result Date: 04/09/2018 CLINICAL DATA:  Left shoulder pain and limited range of motion. No known injury. History of rotator cuff repair. EXAM: LEFT SHOULDER - 2+ VIEW COMPARISON:  MRI left shoulder 02/10/2005. FINDINGS: There is no acute bony or joint abnormality. Two soft  tissue anchors in the humeral head for rotator cuff repair noted. Mild to moderate acromioclavicular degenerative change is seen. Glenohumeral joint is unremarkable. Imaged left lung and ribs are unremarkable. Aortic atherosclerosis noted. IMPRESSION: No acute abnormality. Mild to moderate acromioclavicular osteoarthritis. Electronically Signed   By: Drusilla Kanner M.D.   On: 04/09/2018 12:36    Procedures Procedures (including critical care time)  Medications Ordered in ED Medications - No data to display   Initial Impression / Assessment and Plan / ED Course  I have reviewed the triage vital signs and the nursing notes.  Pertinent labs & imaging results that were available during my care of the patient were reviewed by me and considered in my medical decision making (see chart for details).   PT awake and alert on exam, reassuring Vs. Obtained CT head and c-spine given pt is poor historian, along w/ XR L shoulder.  All imaging negative for acute injury. Pt comfortable on reassessment. Discussed porta measures and extensively reviewed return precautions.  He voiced understanding and was discharged back to his assisted living facility. Final Clinical Impressions(s) / ED Diagnoses   Final diagnoses:  Fall, initial encounter  Contusion of left shoulder, initial encounter    ED Discharge Orders    None       Little, Ambrose Finland, MD 04/09/18 1342

## 2018-04-09 NOTE — ED Notes (Signed)
Report to [facility] at [facility].

## 2018-04-09 NOTE — ED Notes (Signed)
Patient transported to X-ray 

## 2018-04-09 NOTE — ED Triage Notes (Signed)
Pt to ED via EMS [facility] s/p fall; pt c/o LT shoulder and clavicle pain

## 2018-04-10 ENCOUNTER — Non-Acute Institutional Stay (SKILLED_NURSING_FACILITY): Payer: Medicare HMO | Admitting: Internal Medicine

## 2018-04-10 DIAGNOSIS — K7031 Alcoholic cirrhosis of liver with ascites: Secondary | ICD-10-CM | POA: Diagnosis not present

## 2018-04-12 LAB — HEPATIC FUNCTION PANEL
ALK PHOS: 91 (ref 25–125)
ALT: 5 — AB (ref 10–40)
AST: 32 (ref 14–40)
Bilirubin, Total: 0.9

## 2018-04-12 LAB — CBC AND DIFFERENTIAL
HCT: 27 — AB (ref 41–53)
Hemoglobin: 8.5 — AB (ref 13.5–17.5)
NEUTROS ABS: 2
Platelets: 65 — AB (ref 150–399)
WBC: 4.2

## 2018-04-12 LAB — BASIC METABOLIC PANEL
BUN: 17 (ref 4–21)
CREATININE: 1 (ref 0.6–1.3)
Glucose: 80
Potassium: 3.8 (ref 3.4–5.3)
SODIUM: 143 (ref 137–147)

## 2018-04-13 ENCOUNTER — Non-Acute Institutional Stay (SKILLED_NURSING_FACILITY): Payer: Medicare HMO | Admitting: Internal Medicine

## 2018-04-13 ENCOUNTER — Encounter: Payer: Self-pay | Admitting: Internal Medicine

## 2018-04-13 DIAGNOSIS — F329 Major depressive disorder, single episode, unspecified: Secondary | ICD-10-CM | POA: Diagnosis not present

## 2018-04-13 DIAGNOSIS — F0281 Dementia in other diseases classified elsewhere with behavioral disturbance: Secondary | ICD-10-CM | POA: Diagnosis not present

## 2018-04-13 DIAGNOSIS — G300 Alzheimer's disease with early onset: Secondary | ICD-10-CM

## 2018-04-13 DIAGNOSIS — K7460 Unspecified cirrhosis of liver: Secondary | ICD-10-CM | POA: Diagnosis not present

## 2018-04-13 DIAGNOSIS — R188 Other ascites: Secondary | ICD-10-CM

## 2018-04-13 DIAGNOSIS — F32A Depression, unspecified: Secondary | ICD-10-CM

## 2018-04-13 NOTE — Progress Notes (Signed)
Location:  [facility] Room Number: [phone] Place of Service:  SNF ([phone])  Margit Hanks, MD  Patient Care Team: Margit Hanks, MD as PCP - General (Internal Medicine)  Extended Emergency Contact Information Primary Emergency Contact: Olenik,Homer Home Phone: [phone] Relation: Brother    Allergies: Ace inhibitors; Latex; and Morphine  Chief Complaint  Patient presents with  . Medical Management of Chronic Issues    HPI: Patient is 67 y.o. male who is being seen for routine issues of liver cirrhosis with ascites, dementia, and depression.  Past Medical History:  Diagnosis Date  . AKI (acute kidney injury) (HCC) 12/25/2017  . CHF (congestive heart failure) (HCC)    diastolic  . Cirrhosis of liver with ascites (HCC)   . COPD (chronic obstructive pulmonary disease) (HCC)   . Dementia   . Depressed   . Diabetes mellitus (HCC)   . Diabetic neuropathy (HCC)   . Edema   . GERD (gastroesophageal reflux disease)   . GI bleed   . Gynecomastia, male 06/2017  . Hyperammonemia (HCC)   . Hyperlipidemia   . Hypertension   . Kidney stones   . Liver cirrhosis (HCC)   . Major depression   . Metabolic encephalopathy   . RA (rheumatoid arthritis) (HCC)   . Restless leg syndrome     Past Surgical History:  Procedure Laterality Date  . BACK SURGERY    . COLONOSCOPY  02/13/2018   Procedure: Colonoscopy; Surgeon: Chaney Born, MD; Location: [facility] ENDO OR; Service: Gastroenterology; Laterality: N/A;  . ESOPHAGOSCOPY  02/13/2018   Procedure: EGD; Surgeon: Chaney Born, MD; Location: [facility] ENDO OR; Service: Gastroenterology; Laterality: N/A;  . EYE SURGERY    . HEMORRHOID SURGERY    . LUMBAR FUSION    . ROTATOR CUFF REPAIR Bilateral   . UPPER GI ENDOSCOPY Left 04/17/2016   Procedure: UGI ENDO, INCLUDE ESOPHAGUS, STOMACH, & DUODENUM &/OR JEJUNUM; DX W/WO COLLECTION SPECIMN,; Surgeon: Stasia Cavalier, MD; Location: Endo  Procedures [facility]; Service: Gastroenterology    Allergies as of 04/13/2018      Reactions   Ace Inhibitors Swelling   Other reaction(s): Unknown   Latex Rash   "Latex Tape" per patient "Latex Tape" per patient "Latex Tape" per patient   Morphine Nausea And Vomiting   Other reaction(s): Unknown      Medication List        Accurate as of 04/13/18 11:59 PM. Always use your most recent med list.          acetaminophen 325 MG tablet Commonly known as:  TYLENOL Take 650 mg by mouth every 6 (six) hours as needed.   albuterol 108 (90 Base) MCG/ACT inhaler Commonly known as:  PROVENTIL HFA;VENTOLIN HFA Inhale 2 puffs into the lungs every 4 (four) hours as needed.   aspirin 81 MG chewable tablet Chew 81 mg by mouth daily.   atorvastatin 20 MG tablet Commonly known as:  LIPITOR Take 20 mg by mouth daily.   ceFAZolin 2-3 GM-%(50ML) Solr Commonly known as:  ANCEF Inject 2 g into the vein every 8 (eight) hours.   donepezil 5 MG tablet Commonly known as:  ARICEPT Take 5 mg by mouth at bedtime.   ferrous sulfate 325 (65 FE) MG tablet Take 325 mg by mouth 2 (two) times daily with a meal.   fluticasone-salmeterol 115-21 MCG/ACT inhaler Commonly known as:  ADVAIR HFA Inhale 2 puffs into the lungs daily.   folic acid 1 MG  tablet Commonly known as:  FOLVITE Take 1 mg by mouth daily.   furosemide 20 MG tablet Commonly known as:  LASIX Take 40 mg by mouth daily. 2 tabs   lidocaine 5 % Commonly known as:  LIDODERM Place 1 patch onto the skin daily. Remove & Discard patch within 12 hours or as directed by MD. Apply one patch to lower back in the am.   magnesium oxide 400 MG tablet Commonly known as:  MAG-OX Take 400 mg by mouth 2 (two) times daily.   metFORMIN 500 MG tablet Commonly known as:  GLUCOPHAGE Take 500 mg by mouth 2 (two) times daily with a meal.   metoprolol tartrate 25 MG tablet Commonly known as:  LOPRESSOR Take 12.5 mg by mouth 2 (two) times daily.     mirtazapine 30 MG tablet Commonly known as:  REMERON Take 30 mg by mouth at bedtime.   nitroGLYCERIN 0.4 MG SL tablet Commonly known as:  NITROSTAT Place 0.4 mg under the tongue as needed. Every 5 minutes as needed for chest pain. Call Dr/911 if take 2 doses. Max 3/day   ondansetron 4 MG tablet Commonly known as:  ZOFRAN Take 4 mg by mouth as needed. Every 6 - 8 hours   pantoprazole 40 MG tablet Commonly known as:  PROTONIX Take 40 mg by mouth daily.   potassium chloride SA 20 MEQ tablet Commonly known as:  K-DUR,KLOR-CON Take 30 mEq by mouth daily.   pramipexole 1.5 MG tablet Commonly known as:  MIRAPEX Take 1.5 mg by mouth every evening.   pregabalin 50 MG capsule Commonly known as:  LYRICA Take 50 mg by mouth 3 (three) times daily.   pregabalin 25 MG capsule Commonly known as:  LYRICA Take 50 mg by mouth 3 (three) times daily. 2 caps   QUEtiapine 50 MG tablet Commonly known as:  SEROQUEL Take 50 mg by mouth at bedtime.   sertraline 50 MG tablet Commonly known as:  ZOLOFT Take 75 mg by mouth daily.   spironolactone 50 MG tablet Commonly known as:  ALDACTONE Take 50 mg by mouth daily.   traZODone 50 MG tablet Commonly known as:  DESYREL Take 50 mg by mouth at bedtime.   Vitamin D (Ergocalciferol) 50000 units Caps capsule Commonly known as:  DRISDOL Take 50,000 Units by mouth once a week. On Wednesday       No orders of the defined types were placed in this encounter.   Immunization History  Administered Date(s) Administered  . Influenza, High Dose Seasonal PF 10/05/2016, 09/15/2017  . Influenza, Seasonal, Injecte, Preservative Fre 10/17/2015  . Influenza-Unspecified 10/17/2015, 10/05/2016, 08/20/2017  . Pneumococcal Conjugate-13 11/24/2017, 11/24/2017  . Pneumococcal Polysaccharide-23 06/17/2016, 06/17/2016  . Pneumococcal-Unspecified 06/17/2016    Social History   Tobacco Use  . Smoking status: Former Smoker    Last attempt to quit:  07/12/2005    Years since quitting: 12.8  . Smokeless tobacco: Never Used  Substance Use Topics  . Alcohol use: Not Currently    Frequency: Never    Comment: Alcohol use years ago; patient was also diagnosed with cirrhosis years ago    Review of Systems  DATA OBTAINED: from patient, nurse GENERAL:  no fevers, fatigue, appetite changes SKIN: No itching, rash HEENT: No complaint RESPIRATORY: No cough, wheezing, no more SOB than usual CARDIAC: No chest pain, palpitations, lower extremity edema  GI: No abdominal pain, No N/V/D or constipation, No heartburn or reflux  GU: No dysuria, frequency or urgency, or incontinence  MUSCULOSKELETAL: No unrelieved bone/joint pain NEUROLOGIC: No headache, dizziness  PSYCHIATRIC: No overt anxiety or sadness  Vitals:   04/13/18 1333  BP: 114/66  Pulse: 72  Resp: (!) 26  Temp: (!) 97 F (36.1 C)  SpO2: 96%   Body mass index is 37.38 kg/m. Physical Exam  GENERAL APPEARANCE: Alert, conversant, No acute distress  SKIN: No diaphoresis rash HEENT: Unremarkable RESPIRATORY: Breathing is even, unlabored. Lung sounds are clear   CARDIOVASCULAR: Heart RRR no murmurs, rubs or gallops. + peripheral edema  GASTROINTESTINAL: Abdomen is soft, non-tender,  +distended w/ normal bowel sounds.  GENITOURINARY: Bladder non tender, not distended  MUSCULOSKELETAL: No abnormal joints or musculature NEUROLOGIC: Cranial nerves 2-12 grossly intact. Moves all extremities PSYCHIATRIC: Mood and affect appropriate to situation with some dementia, no behavioral issues  Patient Active Problem List   Diagnosis Date Noted  . CHF (congestive heart failure) (HCC) 03/18/2018  . Mood disorder (HCC) 03/18/2018  . MSSA bacteremia 02/22/2018  . 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 Fall 02/22/2018  . Rhabdomyolysis 02/22/2018  . Hypomagnesemia 02/22/2018  . Hypokalemia 02/22/2018  . Hyponatremia 02/22/2018  . Hypophosphatemia 02/22/2018  . Knee pain, right 02/22/2018  .  Cirrhosis of liver with ascites (HCC) 02/22/2018  . Thrombocytopenia (HCC) 02/22/2018  . Metabolic encephalopathy 02/22/2018  . COPD exacerbation (HCC) 02/22/2018  . Ventricular tachycardia, nonsustained (HCC) 02/22/2018  . Diabetes mellitus (HCC) 02/22/2018  . Hyperlipidemia associated with type 2 diabetes mellitus (HCC) 02/22/2018  . Dementia 02/22/2018  . Depressed 02/22/2018    CMP     Component Value Date/Time   NA 143 04/12/2018   NA 144 03/15/2018   K 3.8 04/12/2018   K 4.2 03/15/2018   BUN 17 04/12/2018   CREATININE 1.0 04/12/2018   CREATININE 0.74 03/15/2018   CALCIUM 8.9 03/15/2018   PROT 6 03/15/2018   ALBUMIN 3.6 03/15/2018   AST 32 04/12/2018   AST 26 03/15/2018   ALT 5 (A) 04/12/2018   ALT 5 03/15/2018   ALKPHOS 91 04/12/2018   ALKPHOS 92 03/15/2018   BILITOT 1.3 03/15/2018   Recent Labs    03/05/18 03/15/18 04/12/18  NA 141 144 143  K 3.9 4.2 3.8  BUN 15 17 17   CREATININE 0.7 0.74 1.0  CALCIUM  --  8.9  --    Recent Labs    03/05/18 03/15/18 04/12/18  AST 48* 26 32  ALT 9* 5 5*  ALKPHOS 120 92 91  BILITOT  --  1.3  --   PROT  --  6  --   ALBUMIN  --  3.6  --    Recent Labs    02/17/18 03/05/18 03/15/18 04/12/18  WBC 6.8 6.7 4.9 4.2  NEUTROABS  --   --   --  2  HGB 10.1* 8.8* 7.7 8.5*  HCT 30* 26* 24.2 27*  PLT 151 96*  --  65*   No results for input(s): CHOL, LDLCALC, TRIG in the last 8760 hours.  Invalid input(s): HCL No results found for: MICROALBUR No results found for: TSH No results found for: HGBA1C No results found for: CHOL, HDL, LDLCALC, LDLDIRECT, TRIG, CHOLHDL  Significant Diagnostic Results in last 30 days:  Ct Head Wo Contrast  Result Date: 04/09/2018 CLINICAL DATA:  Status post fall today. Headache. Initial encounter. EXAM: CT HEAD WITHOUT CONTRAST CT CERVICAL SPINE WITHOUT CONTRAST TECHNIQUE: Multidetector CT imaging of the head and cervical spine was performed following the standard protocol without intravenous  contrast. Multiplanar CT image reconstructions  of the cervical spine were also generated. COMPARISON:  Head CT scan 10/05/2017. FINDINGS: CT HEAD FINDINGS Brain: No evidence of acute infarction, hemorrhage, hydrocephalus, extra-axial collection or mass lesion/mass effect. Vascular: Atherosclerosis noted. Skull: Intact. Sinuses/Orbits: Status post lens extraction.  Otherwise negative. Other: None. CT CERVICAL SPINE FINDINGS Alignment: Maintained.  Mild reversal of lordosis noted. Skull base and vertebrae: No acute fracture. No primary bone lesion or focal pathologic process. Soft tissues and spinal canal: No prevertebral fluid or swelling. No visible canal hematoma. Disc levels: Loss of disc space height appears worst at C4-5, C5-6 and C6-7. Upper chest: Extensive emphysema is present.  No acute abnormality. Other: None. IMPRESSION: No acute abnormality head or cervical spine. Atherosclerosis. Degenerative disc disease C4-C7. Emphysema. Electronically Signed   By: Drusilla Kannerhomas  Dalessio M.D.   On: 04/09/2018 12:51   Ct Cervical Spine Wo Contrast  Result Date: 04/09/2018 CLINICAL DATA:  Status post fall today. Headache. Initial encounter. EXAM: CT HEAD WITHOUT CONTRAST CT CERVICAL SPINE WITHOUT CONTRAST TECHNIQUE: Multidetector CT imaging of the head and cervical spine was performed following the standard protocol without intravenous contrast. Multiplanar CT image reconstructions of the cervical spine were also generated. COMPARISON:  Head CT scan 10/05/2017. FINDINGS: CT HEAD FINDINGS Brain: No evidence of acute infarction, hemorrhage, hydrocephalus, extra-axial collection or mass lesion/mass effect. Vascular: Atherosclerosis noted. Skull: Intact. Sinuses/Orbits: Status post lens extraction.  Otherwise negative. Other: None. CT CERVICAL SPINE FINDINGS Alignment: Maintained.  Mild reversal of lordosis noted. Skull base and vertebrae: No acute fracture. No primary bone lesion or focal pathologic process. Soft tissues and  spinal canal: No prevertebral fluid or swelling. No visible canal hematoma. Disc levels: Loss of disc space height appears worst at C4-5, C5-6 and C6-7. Upper chest: Extensive emphysema is present.  No acute abnormality. Other: None. IMPRESSION: No acute abnormality head or cervical spine. Atherosclerosis. Degenerative disc disease C4-C7. Emphysema. Electronically Signed   By: Drusilla Kannerhomas  Dalessio M.D.   On: 04/09/2018 12:51   Dg Shoulder Left  Result Date: 04/09/2018 CLINICAL DATA:  Left shoulder pain and limited range of motion. No known injury. History of rotator cuff repair. EXAM: LEFT SHOULDER - 2+ VIEW COMPARISON:  MRI left shoulder 02/10/2005. FINDINGS: There is no acute bony or joint abnormality. Two soft tissue anchors in the humeral head for rotator cuff repair noted. Mild to moderate acromioclavicular degenerative change is seen. Glenohumeral joint is unremarkable. Imaged left lung and ribs are unremarkable. Aortic atherosclerosis noted. IMPRESSION: No acute abnormality. Mild to moderate acromioclavicular osteoarthritis. Electronically Signed   By: Drusilla Kannerhomas  Dalessio M.D.   On: 04/09/2018 12:36    Assessment and Plan  Cirrhosis of liver with ascites (HCC) Last paracentesis patient has 6 L taken off; negative been to weeks since his prior paracentesis; will plan to have paracentesis weekly  Dementia Stable; continue Aricept 5 mg p.o. daily  Depressed Stable; continue Remeron 30 mg daily, Zoloft 75 mg daily and trazodone 50 mg daily at bedtime     Thurston HoleAnne D. Lyn HollingsheadAlexander, MD

## 2018-04-20 ENCOUNTER — Encounter: Payer: Self-pay | Admitting: Internal Medicine

## 2018-04-27 ENCOUNTER — Non-Acute Institutional Stay (SKILLED_NURSING_FACILITY): Payer: Medicare HMO | Admitting: Internal Medicine

## 2018-04-27 DIAGNOSIS — E8779 Other fluid overload: Secondary | ICD-10-CM | POA: Diagnosis not present

## 2018-04-27 DIAGNOSIS — R188 Other ascites: Secondary | ICD-10-CM | POA: Diagnosis not present

## 2018-04-27 DIAGNOSIS — E872 Acidosis, unspecified: Secondary | ICD-10-CM

## 2018-04-27 DIAGNOSIS — K767 Hepatorenal syndrome: Secondary | ICD-10-CM

## 2018-04-27 DIAGNOSIS — K7460 Unspecified cirrhosis of liver: Secondary | ICD-10-CM | POA: Diagnosis not present

## 2018-04-27 DIAGNOSIS — N179 Acute kidney failure, unspecified: Secondary | ICD-10-CM | POA: Diagnosis not present

## 2018-04-27 DIAGNOSIS — E875 Hyperkalemia: Secondary | ICD-10-CM | POA: Diagnosis not present

## 2018-04-27 NOTE — Progress Notes (Signed)
Location:  [facility]   Place of Service:  SNF (31)  Margit Hanks, MD  Patient Care Team: Margit Hanks, MD as PCP - General (Internal Medicine)  Extended Emergency Contact Information Primary Emergency Contact: Peets,Homer Home Phone: [phone] Relation: Brother    Allergies: Ace inhibitors; Latex; and Morphine  Chief Complaint  Patient presents with  . Acute Visit    HPI: Patient is 67 y.o. male who   Concern for hepato renal synmdrome, hyperkalemia and fluis overload in the face of rising Cr is being seen today because lab work came back and is abnormal.  Patient is potassium is 6.4, his CO2 is 18 and his creatinine is 2.05.  Examination of the patient, who had paracentesis today with removal fluid, shows a white male with 2+ edema shortness of breath even while sitting in his chair reaching to find something and Rales at the lung bases.  Patient does admit that he is short of breath.  Patient does denied chest pain nausea or other cardiac symptoms.  Patient's abdomen is huge even though he did undergo paracentesis today.  Past Medical History:  Diagnosis Date  . AKI (acute kidney injury) (HCC) 12/25/2017  . CHF (congestive heart failure) (HCC)    diastolic  . Cirrhosis of liver with ascites (HCC)   . COPD (chronic obstructive pulmonary disease) (HCC)   . Dementia   . Depressed   . Diabetes mellitus (HCC)   . Diabetic neuropathy (HCC)   . Edema   . GERD (gastroesophageal reflux disease)   . GI bleed   . Gynecomastia, male 06/2017  . Hyperammonemia (HCC)   . Hyperlipidemia   . Hypertension   . Kidney stones   . Liver cirrhosis (HCC)   . Major depression   . Metabolic encephalopathy   . RA (rheumatoid arthritis) (HCC)   . Restless leg syndrome     Past Surgical History:  Procedure Laterality Date  . BACK SURGERY    . COLONOSCOPY  02/13/2018   Procedure: Colonoscopy; Surgeon: Chaney Born, MD; Location: [facility] OR;  Service: Gastroenterology; Laterality: N/A;  . ESOPHAGOSCOPY  02/13/2018   Procedure: EGD; Surgeon: Chaney Born, MD; Location: [facility] OR; Service: Gastroenterology; Laterality: N/A;  . EYE SURGERY    . HEMORRHOID SURGERY    . LUMBAR FUSION    . ROTATOR CUFF REPAIR Bilateral   . UPPER GI ENDOSCOPY Left 04/17/2016   Procedure: UGI ENDO, INCLUDE ESOPHAGUS, STOMACH, & DUODENUM &/OR JEJUNUM; DX W/WO COLLECTION SPECIMN,; Surgeon: Stasia Cavalier, MD; Location: [facility]; Service: Gastroenterology    Allergies as of 04/27/2018      Reactions   Ace Inhibitors Swelling   Other reaction(s): Unknown   Latex Rash   "Latex Tape" per patient "Latex Tape" per patient "Latex Tape" per patient   Morphine Nausea And Vomiting   Other reaction(s): Unknown      Medication List        Accurate as of 04/27/18 11:59 PM. Always use your most recent med list.          acetaminophen 325 MG tablet Commonly known as:  TYLENOL Take 650 mg by mouth every 6 (six) hours as needed.   albuterol 108 (90 Base) MCG/ACT inhaler Commonly known as:  PROVENTIL HFA;VENTOLIN HFA Inhale 2 puffs into the lungs every 4 (four) hours as needed.   aspirin 81 MG chewable tablet Chew 81 mg by mouth daily.   atorvastatin 20 MG tablet Commonly  known as:  LIPITOR Take 20 mg by mouth daily.   ceFAZolin 2-3 GM-%(50ML) Solr Commonly known as:  ANCEF Inject 2 g into the vein every 8 (eight) hours.   donepezil 5 MG tablet Commonly known as:  ARICEPT Take 5 mg by mouth at bedtime.   ferrous sulfate 325 (65 FE) MG tablet Take 325 mg by mouth 2 (two) times daily with a meal.   fluticasone-salmeterol 115-21 MCG/ACT inhaler Commonly known as:  ADVAIR HFA Inhale 2 puffs into the lungs daily.   folic acid 1 MG tablet Commonly known as:  FOLVITE Take 1 mg by mouth daily.   furosemide 20 MG tablet Commonly known as:  LASIX Take 40 mg by mouth daily. 2 tabs   lidocaine 5 % Commonly known as:   LIDODERM Place 1 patch onto the skin daily. Remove & Discard patch within 12 hours or as directed by MD. Apply one patch to lower back in the am.   magnesium oxide 400 MG tablet Commonly known as:  MAG-OX Take 400 mg by mouth 2 (two) times daily.   metFORMIN 500 MG tablet Commonly known as:  GLUCOPHAGE Take 500 mg by mouth 2 (two) times daily with a meal.   metoprolol tartrate 25 MG tablet Commonly known as:  LOPRESSOR Take 12.5 mg by mouth 2 (two) times daily.   mirtazapine 30 MG tablet Commonly known as:  REMERON Take 30 mg by mouth at bedtime.   nitroGLYCERIN 0.4 MG SL tablet Commonly known as:  NITROSTAT Place 0.4 mg under the tongue as needed. Every 5 minutes as needed for chest pain. Call Dr/911 if take 2 doses. Max 3/day   ondansetron 4 MG tablet Commonly known as:  ZOFRAN Take 4 mg by mouth as needed. Every 6 - 8 hours   pantoprazole 40 MG tablet Commonly known as:  PROTONIX Take 40 mg by mouth daily.   potassium chloride SA 20 MEQ tablet Commonly known as:  K-DUR,KLOR-CON Take 30 mEq by mouth daily.   pramipexole 1.5 MG tablet Commonly known as:  MIRAPEX Take 1.5 mg by mouth every evening.   pregabalin 50 MG capsule Commonly known as:  LYRICA Take 50 mg by mouth 3 (three) times daily.   pregabalin 25 MG capsule Commonly known as:  LYRICA Take 50 mg by mouth 3 (three) times daily. 2 caps   QUEtiapine 50 MG tablet Commonly known as:  SEROQUEL Take 50 mg by mouth at bedtime.   sertraline 50 MG tablet Commonly known as:  ZOLOFT Take 75 mg by mouth daily.   spironolactone 50 MG tablet Commonly known as:  ALDACTONE Take 50 mg by mouth daily.   traZODone 50 MG tablet Commonly known as:  DESYREL Take 50 mg by mouth at bedtime.   Vitamin D (Ergocalciferol) 50000 units Caps capsule Commonly known as:  DRISDOL Take 50,000 Units by mouth once a week. On Wednesday       No orders of the defined types were placed in this encounter.   Immunization  History  Administered Date(s) Administered  . Influenza, High Dose Seasonal PF 10/05/2016, 09/15/2017  . Influenza, Seasonal, Injecte, Preservative Fre 10/17/2015  . Influenza-Unspecified 10/17/2015, 10/05/2016, 08/20/2017  . Pneumococcal Conjugate-13 11/24/2017, 11/24/2017  . Pneumococcal Polysaccharide-23 06/17/2016, 06/17/2016  . Pneumococcal-Unspecified 06/17/2016    Social History   Tobacco Use  . Smoking status: Former Smoker    Last attempt to quit: 07/12/2005    Years since quitting: 12.8  . Smokeless tobacco: Never Used  Substance  Use Topics  . Alcohol use: Not Currently    Frequency: Never    Comment: Alcohol use years ago; patient was also diagnosed with cirrhosis years ago    Review of Systems  DATA OBTAINED: from patient GENERAL:  no fevers,+ fatigue, appetite changes SKIN: No itching, rash HEENT: No complaint RESPIRATORY: No cough, wheezing, +SOB CARDIAC: No chest pain, palpitations, lower extremity edema  GI: No abdominal pain, No N/V/D or constipation, No heartburn or reflux  GU: No dysuria, frequency or urgency, or incontinence  MUSCULOSKELETAL: No unrelieved bone/joint pain NEUROLOGIC: No headache, dizziness  PSYCHIATRIC: No overt anxiety or sadness  Vitals:   05/07/18 1525  BP: 114/66  Pulse: 72  Resp: (!) 26  Temp: (!) 97 F (36.1 C)  SpO2: 96%   There is no height or weight on file to calculate BMI. Physical Exam  GENERAL APPEARANCE: Alert, conversant some acute distress  SKIN: No diaphoresis rash HEENT: Unremarkable RESPIRATORY: Breathing is even, mild labored. Lung sounds are rales at bases CARDIOVASCULAR: Heart RRR no murmurs, rubs or gallops. 2+ in peripheral edema  GASTROINTESTINAL: Abdomen is soft, non-tender, very distended w/ normal bowel sounds.  GENITOURINARY: Bladder non tender, not distended  MUSCULOSKELETAL: No abnormal joints or musculature NEUROLOGIC: Cranial nerves 2-12 grossly intact. Moves all extremities PSYCHIATRIC:  Mood and affect appropriate to situation with some dementia, no behavioral issues  Patient Active Problem List   Diagnosis Date Noted  . CHF (congestive heart failure) (HCC) 03/18/2018  . Mood disorder (HCC) 03/18/2018  . MSSA bacteremia 02/22/2018  . 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 Fall 02/22/2018  . Rhabdomyolysis 02/22/2018  . Hypomagnesemia 02/22/2018  . Hypokalemia 02/22/2018  . Hyponatremia 02/22/2018  . Hypophosphatemia 02/22/2018  . Knee pain, right 02/22/2018  . Cirrhosis of liver with ascites (HCC) 02/22/2018  . Thrombocytopenia (HCC) 02/22/2018  . Metabolic encephalopathy 02/22/2018  . COPD exacerbation (HCC) 02/22/2018  . Ventricular tachycardia, nonsustained (HCC) 02/22/2018  . Diabetes mellitus (HCC) 02/22/2018  . Hyperlipidemia associated with type 2 diabetes mellitus (HCC) 02/22/2018  . Dementia 02/22/2018  . Depressed 02/22/2018    CMP     Component Value Date/Time   NA 143 04/12/2018   NA 144 03/15/2018   K 3.8 04/12/2018   K 4.2 03/15/2018   BUN 17 04/12/2018   CREATININE 1.0 04/12/2018   CREATININE 0.74 03/15/2018   CALCIUM 8.9 03/15/2018   PROT 6 03/15/2018   ALBUMIN 3.6 03/15/2018   AST 32 04/12/2018   AST 26 03/15/2018   ALT 5 (A) 04/12/2018   ALT 5 03/15/2018   ALKPHOS 91 04/12/2018   ALKPHOS 92 03/15/2018   BILITOT 1.3 03/15/2018   Recent Labs    03/05/18 03/15/18 04/12/18  NA 141 144 143  K 3.9 4.2 3.8  BUN CREATININE 0.7 0.74 1.0  CALCIUM  --  8.9  --    Recent Labs    03/05/18 03/15/18 04/12/18  AST 48* 26 32  ALT 9* 5 5*  ALKPHOS 120 92 91  BILITOT  --  1.3  --   PROT  --  6  --   ALBUMIN  --  3.6  --    Recent Labs    02/17/18 03/05/18 03/15/18 04/12/18  WBC 6.8 6.7 4.9 4.2  NEUTROABS  --   --   --  2  HGB 10.1* 8.8* 7.7 8.5*  HCT 30* 26* 24.2 27*  PLT 151 96*  --  65*   No results for input(s): CHOL,  LDLCALC, TRIG in the last 8760 hours.  Invalid input(s): HCL No results found for: MICROALBUR No  results found for: TSH No results found for: HGBA1C No results found for: CHOL, HDL, LDLCALC, LDLDIRECT, TRIG, CHOLHDL  Significant Diagnostic Results in last 30 days:  Ct Head Wo Contrast  Result Date: 04/09/2018 CLINICAL DATA:  Status post fall today. Headache. Initial encounter. EXAM: CT HEAD WITHOUT CONTRAST CT CERVICAL SPINE WITHOUT CONTRAST TECHNIQUE: Multidetector CT imaging of the head and cervical spine was performed following the standard protocol without intravenous contrast. Multiplanar CT image reconstructions of the cervical spine were also generated. COMPARISON:  Head CT scan 10/05/2017. FINDINGS: CT HEAD FINDINGS Brain: No evidence of acute infarction, hemorrhage, hydrocephalus, extra-axial collection or mass lesion/mass effect. Vascular: Atherosclerosis noted. Skull: Intact. Sinuses/Orbits: Status post lens extraction.  Otherwise negative. Other: None. CT CERVICAL SPINE FINDINGS Alignment: Maintained.  Mild reversal of lordosis noted. Skull base and vertebrae: No acute fracture. No primary bone lesion or focal pathologic process. Soft tissues and spinal canal: No prevertebral fluid or swelling. No visible canal hematoma. Disc levels: Loss of disc space height appears worst at C4-5, C5-6 and C6-7. Upper chest: Extensive emphysema is present.  No acute abnormality. Other: None. IMPRESSION: No acute abnormality head or cervical spine. Atherosclerosis. Degenerative disc disease C4-C7. Emphysema. Electronically Signed   By: Drusilla Kanner M.D.   On: 04/09/2018 12:51   Ct Cervical Spine Wo Contrast  Result Date: 04/09/2018 CLINICAL DATA:  Status post fall today. Headache. Initial encounter. EXAM: CT HEAD WITHOUT CONTRAST CT CERVICAL SPINE WITHOUT CONTRAST TECHNIQUE: Multidetector CT imaging of the head and cervical spine was performed following the standard protocol without intravenous contrast. Multiplanar CT image reconstructions of the cervical spine were also generated. COMPARISON:  Head  CT scan 10/05/2017. FINDINGS: CT HEAD FINDINGS Brain: No evidence of acute infarction, hemorrhage, hydrocephalus, extra-axial collection or mass lesion/mass effect. Vascular: Atherosclerosis noted. Skull: Intact. Sinuses/Orbits: Status post lens extraction.  Otherwise negative. Other: None. CT CERVICAL SPINE FINDINGS Alignment: Maintained.  Mild reversal of lordosis noted. Skull base and vertebrae: No acute fracture. No primary bone lesion or focal pathologic process. Soft tissues and spinal canal: No prevertebral fluid or swelling. No visible canal hematoma. Disc levels: Loss of disc space height appears worst at C4-5, C5-6 and C6-7. Upper chest: Extensive emphysema is present.  No acute abnormality. Other: None. IMPRESSION: No acute abnormality head or cervical spine. Atherosclerosis. Degenerative disc disease C4-C7. Emphysema. Electronically Signed   By: Drusilla Kanner M.D.   On: 04/09/2018 12:51   Dg Shoulder Left  Result Date: 04/09/2018 CLINICAL DATA:  Left shoulder pain and limited range of motion. No known injury. History of rotator cuff repair. EXAM: LEFT SHOULDER - 2+ VIEW COMPARISON:  MRI left shoulder 02/10/2005. FINDINGS: There is no acute bony or joint abnormality. Two soft tissue anchors in the humeral head for rotator cuff repair noted. Mild to moderate acromioclavicular degenerative change is seen. Glenohumeral joint is unremarkable. Imaged left lung and ribs are unremarkable. Aortic atherosclerosis noted. IMPRESSION: No acute abnormality. Mild to moderate acromioclavicular osteoarthritis. Electronically Signed   By: Drusilla Kanner M.D.   On: 04/09/2018 12:36    Assessment and Plan  Hepatorenal syndrome/acute kidney injury/acidosis/hyperkalemia/fluid overload/liver cirrhosis with ascites- I have concern for this; patient's creatinine has doubled in the past 3 weeks; patient is not getting relief even with paracentesis; patient's edema needs to be treated with increased Lasix, however  increased Lasix is going to hurt his kidneys even more and  needs to be done very carefully with the ability to have reliable lab work on a daily basis; patient is acidotic with a CO2 of 18 and his potassium is 6.4; I discussed this with our DON and we are sending patient to the hospital for potential hepatorenal syndrome   Time spent greater than 35 minutes;> 50% of time with patient was spent reviewing records, labs, tests and studies, counseling and developing plan of care  Merrilee Seashore, MD

## 2018-04-29 ENCOUNTER — Encounter: Payer: Self-pay | Admitting: Internal Medicine

## 2018-04-29 NOTE — Progress Notes (Signed)
Location:  [facility]   Place of Service:  SNF (31)  Margit Hanks, MD  Patient Care Team: Margit Hanks, MD as PCP - General (Internal Medicine)  Extended Emergency Contact Information Primary Emergency Contact: Cardy,Homer Home Phone: [phone] Relation: Brother    Allergies: Ace inhibitors; Latex; and Morphine  Chief Complaint  Patient presents with  . Acute Visit    HPI: Patient is 67 y.o. male who nursing asked me to see because he is gaining weight.  On 4/11 patient weighed 204, last weight 4/21 patient weighs 211.  Patient's abdomen appears quite large and patient appears short of breath and has pedal edema.  Past Medical History:  Diagnosis Date  . AKI (acute kidney injury) (HCC) 12/25/2017  . CHF (congestive heart failure) (HCC)    diastolic  . Cirrhosis of liver with ascites (HCC)   . COPD (chronic obstructive pulmonary disease) (HCC)   . Dementia   . Depressed   . Diabetes mellitus (HCC)   . Diabetic neuropathy (HCC)   . Edema   . GERD (gastroesophageal reflux disease)   . GI bleed   . Gynecomastia, male 06/2017  . Hyperammonemia (HCC)   . Hyperlipidemia   . Hypertension   . Kidney stones   . Liver cirrhosis (HCC)   . Major depression   . Metabolic encephalopathy   . RA (rheumatoid arthritis) (HCC)   . Restless leg syndrome     Past Surgical History:  Procedure Laterality Date  . BACK SURGERY    . COLONOSCOPY  02/13/2018   Procedure: Colonoscopy; Surgeon: Chaney Born, MD; Location: [facility] OR; Service: Gastroenterology; Laterality: N/A;  . ESOPHAGOSCOPY  02/13/2018   Procedure: EGD; Surgeon: Chaney Born, MD; Location: [facility] OR; Service: Gastroenterology; Laterality: N/A;  . EYE SURGERY    . HEMORRHOID SURGERY    . LUMBAR FUSION    . ROTATOR CUFF REPAIR Bilateral   . UPPER GI ENDOSCOPY Left 04/17/2016   Procedure: UGI ENDO, INCLUDE ESOPHAGUS, STOMACH, & DUODENUM &/OR JEJUNUM; DX W/WO  COLLECTION SPECIMN,; Surgeon: Stasia Cavalier, MD; Location: [facility]; Service: Gastroenterology    Allergies as of 04/10/2018      Reactions   Ace Inhibitors Swelling   Other reaction(s): Unknown   Latex Rash   "Latex Tape" per patient "Latex Tape" per patient "Latex Tape" per patient   Morphine Nausea And Vomiting   Other reaction(s): Unknown      Medication List        Accurate as of 04/10/18 11:59 PM. Always use your most recent med list.          acetaminophen 325 MG tablet Commonly known as:  TYLENOL Take 650 mg by mouth every 6 (six) hours as needed.   albuterol 108 (90 Base) MCG/ACT inhaler Commonly known as:  PROVENTIL HFA;VENTOLIN HFA Inhale 2 puffs into the lungs every 4 (four) hours as needed.   aspirin 81 MG chewable tablet Chew 81 mg by mouth daily.   atorvastatin 20 MG tablet Commonly known as:  LIPITOR Take 20 mg by mouth daily.   ceFAZolin 2-3 GM-%(50ML) Solr Commonly known as:  ANCEF Inject 2 g into the vein every 8 (eight) hours.   donepezil 5 MG tablet Commonly known as:  ARICEPT Take 5 mg by mouth at bedtime.   ferrous sulfate 325 (65 FE) MG tablet Take 325 mg by mouth 2 (two) times daily with a meal.   fluticasone-salmeterol 115-21 MCG/ACT inhaler Commonly  known as:  ADVAIR HFA Inhale 2 puffs into the lungs daily.   folic acid 1 MG tablet Commonly known as:  FOLVITE Take 1 mg by mouth daily.   furosemide 20 MG tablet Commonly known as:  LASIX Take 40 mg by mouth daily. 2 tabs   lidocaine 5 % Commonly known as:  LIDODERM Place 1 patch onto the skin daily. Remove & Discard patch within 12 hours or as directed by MD. Apply one patch to lower back in the am.   magnesium oxide 400 MG tablet Commonly known as:  MAG-OX Take 400 mg by mouth 2 (two) times daily.   metFORMIN 500 MG tablet Commonly known as:  GLUCOPHAGE Take 500 mg by mouth 2 (two) times daily with a meal.   metoprolol tartrate 25 MG tablet Commonly  known as:  LOPRESSOR Take 12.5 mg by mouth 2 (two) times daily.   mirtazapine 30 MG tablet Commonly known as:  REMERON Take 30 mg by mouth at bedtime.   nitroGLYCERIN 0.4 MG SL tablet Commonly known as:  NITROSTAT Place 0.4 mg under the tongue as needed. Every 5 minutes as needed for chest pain. Call Dr/911 if take 2 doses. Max 3/day   ondansetron 4 MG tablet Commonly known as:  ZOFRAN Take 4 mg by mouth as needed. Every 6 - 8 hours   oxyCODONE-acetaminophen 5-325 MG tablet Commonly known as:  PERCOCET/ROXICET Take by mouth every 4 (four) hours as needed for severe pain.   pantoprazole 40 MG tablet Commonly known as:  PROTONIX Take 40 mg by mouth daily.   potassium chloride SA 20 MEQ tablet Commonly known as:  K-DUR,KLOR-CON Take 30 mEq by mouth daily.   pramipexole 1.5 MG tablet Commonly known as:  MIRAPEX Take 1.5 mg by mouth every evening.   pregabalin 50 MG capsule Commonly known as:  LYRICA Take 50 mg by mouth 3 (three) times daily.   pregabalin 25 MG capsule Commonly known as:  LYRICA Take 50 mg by mouth 3 (three) times daily. 2 caps   QUEtiapine 50 MG tablet Commonly known as:  SEROQUEL Take 50 mg by mouth at bedtime.   sertraline 50 MG tablet Commonly known as:  ZOLOFT Take 75 mg by mouth daily.   spironolactone 50 MG tablet Commonly known as:  ALDACTONE Take 50 mg by mouth daily.   traZODone 50 MG tablet Commonly known as:  DESYREL Take 50 mg by mouth at bedtime.   Vitamin D (Ergocalciferol) 50000 units Caps capsule Commonly known as:  DRISDOL Take 50,000 Units by mouth once a week. On Wednesday       No orders of the defined types were placed in this encounter.   Immunization History  Administered Date(s) Administered  . Influenza, High Dose Seasonal PF 10/05/2016, 09/15/2017  . Influenza, Seasonal, Injecte, Preservative Fre 10/17/2015  . Influenza-Unspecified 10/17/2015, 10/05/2016, 08/20/2017  . Pneumococcal Conjugate-13 11/24/2017,  11/24/2017  . Pneumococcal Polysaccharide-23 06/17/2016, 06/17/2016  . Pneumococcal-Unspecified 06/17/2016    Social History   Tobacco Use  . Smoking status: Former Smoker    Last attempt to quit: 07/12/2005    Years since quitting: 12.8  . Smokeless tobacco: Never Used  Substance Use Topics  . Alcohol use: Not Currently    Frequency: Never    Comment: Alcohol use years ago; patient was also diagnosed with cirrhosis years ago    Review of Systems  DATA OBTAINED: from patient, nurse GENERAL:  no fevers, fatigue, appetite changes SKIN: No itching, rash HEENT: No  complaint RESPIRATORY: No cough, wheezing, +SOB CARDIAC: No chest pain, palpitations, lower extremity edema  GI: No abdominal pain, No N/V/D or constipation, No heartburn or reflux  GU: No dysuria, frequency or urgency, or incontinence  MUSCULOSKELETAL: No unrelieved bone/joint pain NEUROLOGIC: No headache, dizziness  PSYCHIATRIC: No overt anxiety or sadness  Vitals:   04/29/18 2216  BP: 116/60  Pulse: 80  Resp: 20  Temp: 98.4 F (36.9 C)  SpO2: 95%   Body mass index is 37.38 kg/m. Physical Exam  GENERAL APPEARANCE: Alert, conversant, + distress  SKIN: No diaphoresis rash HEENT: Unremarkable RESPIRATORY: Breathing is even, slt labored. Lung sounds are clear   CARDIOVASCULAR: Heart RRR no murmurs, rubs or gallops. + peripheral edema  GASTROINTESTINAL: Abdomen is soft, non-tender, not distended w/ normal bowel sounds.  GENITOURINARY: Bladder non tender, not distended  MUSCULOSKELETAL: No abnormal joints or musculature NEUROLOGIC: Cranial nerves 2-12 grossly intact. Moves all extremities PSYCHIATRIC: Mood and affect appropriate to situation, no behavioral issues  Patient Active Problem List   Diagnosis Date Noted  . CHF (congestive heart failure) (HCC) 03/18/2018  . Mood disorder (HCC) 03/18/2018  . MSSA bacteremia 02/22/2018  . 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 Fall 02/22/2018  . Rhabdomyolysis  02/22/2018  . Hypomagnesemia 02/22/2018  . Hypokalemia 02/22/2018  . Hyponatremia 02/22/2018  . Hypophosphatemia 02/22/2018  . Knee pain, right 02/22/2018  . Cirrhosis of liver with ascites (HCC) 02/22/2018  . Thrombocytopenia (HCC) 02/22/2018  . Metabolic encephalopathy 02/22/2018  . COPD exacerbation (HCC) 02/22/2018  . Ventricular tachycardia, nonsustained (HCC) 02/22/2018  . Diabetes mellitus (HCC) 02/22/2018  . Hyperlipidemia associated with type 2 diabetes mellitus (HCC) 02/22/2018  . Dementia 02/22/2018  . Depressed 02/22/2018    CMP     Component Value Date/Time   NA 143 04/12/2018   NA 144 03/15/2018   K 3.8 04/12/2018   K 4.2 03/15/2018   BUN 17 04/12/2018   CREATININE 1.0 04/12/2018   CREATININE 0.74 03/15/2018   CALCIUM 8.9 03/15/2018   PROT 6 03/15/2018   ALBUMIN 3.6 03/15/2018   AST 32 04/12/2018   AST 26 03/15/2018   ALT 5 (A) 04/12/2018   ALT 5 03/15/2018   ALKPHOS 91 04/12/2018   ALKPHOS 92 03/15/2018   BILITOT 1.3 03/15/2018   Recent Labs    03/05/18 03/15/18 04/12/18  NA 141 144 143  K 3.9 4.2 3.8  BUN CREATININE 0.7 0.74 1.0  CALCIUM  --  8.9  --    Recent Labs    03/05/18 03/15/18 04/12/18  AST 48* 26 32  ALT 9* 5 5*  ALKPHOS 120 92 91  BILITOT  --  1.3  --   PROT  --  6  --   ALBUMIN  --  3.6  --    Recent Labs    02/17/18 03/05/18 03/15/18 04/12/18  WBC 6.8 6.7 4.9 4.2  NEUTROABS  --   --   --  2  HGB 10.1* 8.8* 7.7 8.5*  HCT 30* 26* 24.2 27*  PLT 151 96*  --  65*   No results for input(s): CHOL, LDLCALC, TRIG in the last 8760 hours.  Invalid input(s): HCL No results found for: MICROALBUR No results found for: TSH No results found for: HGBA1C No results found for: CHOL, HDL, LDLCALC, LDLDIRECT, TRIG, CHOLHDL  Significant Diagnostic Results in last 30 days:  Ct Head Wo Contrast  Result Date: 04/09/2018 CLINICAL DATA:  Status post fall today. Headache. Initial encounter. EXAM: CT  HEAD WITHOUT CONTRAST CT CERVICAL  SPINE WITHOUT CONTRAST TECHNIQUE: Multidetector CT imaging of the head and cervical spine was performed following the standard protocol without intravenous contrast. Multiplanar CT image reconstructions of the cervical spine were also generated. COMPARISON:  Head CT scan 10/05/2017. FINDINGS: CT HEAD FINDINGS Brain: No evidence of acute infarction, hemorrhage, hydrocephalus, extra-axial collection or mass lesion/mass effect. Vascular: Atherosclerosis noted. Skull: Intact. Sinuses/Orbits: Status post lens extraction.  Otherwise negative. Other: None. CT CERVICAL SPINE FINDINGS Alignment: Maintained.  Mild reversal of lordosis noted. Skull base and vertebrae: No acute fracture. No primary bone lesion or focal pathologic process. Soft tissues and spinal canal: No prevertebral fluid or swelling. No visible canal hematoma. Disc levels: Loss of disc space height appears worst at C4-5, C5-6 and C6-7. Upper chest: Extensive emphysema is present.  No acute abnormality. Other: None. IMPRESSION: No acute abnormality head or cervical spine. Atherosclerosis. Degenerative disc disease C4-C7. Emphysema. Electronically Signed   By: Drusilla Kanner M.D.   On: 04/09/2018 12:51   Ct Cervical Spine Wo Contrast  Result Date: 04/09/2018 CLINICAL DATA:  Status post fall today. Headache. Initial encounter. EXAM: CT HEAD WITHOUT CONTRAST CT CERVICAL SPINE WITHOUT CONTRAST TECHNIQUE: Multidetector CT imaging of the head and cervical spine was performed following the standard protocol without intravenous contrast. Multiplanar CT image reconstructions of the cervical spine were also generated. COMPARISON:  Head CT scan 10/05/2017. FINDINGS: CT HEAD FINDINGS Brain: No evidence of acute infarction, hemorrhage, hydrocephalus, extra-axial collection or mass lesion/mass effect. Vascular: Atherosclerosis noted. Skull: Intact. Sinuses/Orbits: Status post lens extraction.  Otherwise negative. Other: None. CT CERVICAL SPINE FINDINGS Alignment:  Maintained.  Mild reversal of lordosis noted. Skull base and vertebrae: No acute fracture. No primary bone lesion or focal pathologic process. Soft tissues and spinal canal: No prevertebral fluid or swelling. No visible canal hematoma. Disc levels: Loss of disc space height appears worst at C4-5, C5-6 and C6-7. Upper chest: Extensive emphysema is present.  No acute abnormality. Other: None. IMPRESSION: No acute abnormality head or cervical spine. Atherosclerosis. Degenerative disc disease C4-C7. Emphysema. Electronically Signed   By: Drusilla Kanner M.D.   On: 04/09/2018 12:51   Dg Shoulder Left  Result Date: 04/09/2018 CLINICAL DATA:  Left shoulder pain and limited range of motion. No known injury. History of rotator cuff repair. EXAM: LEFT SHOULDER - 2+ VIEW COMPARISON:  MRI left shoulder 02/10/2005. FINDINGS: There is no acute bony or joint abnormality. Two soft tissue anchors in the humeral head for rotator cuff repair noted. Mild to moderate acromioclavicular degenerative change is seen. Glenohumeral joint is unremarkable. Imaged left lung and ribs are unremarkable. Aortic atherosclerosis noted. IMPRESSION: No acute abnormality. Mild to moderate acromioclavicular osteoarthritis. Electronically Signed   By: Drusilla Kanner M.D.   On: 04/09/2018 12:36    Assessment and Plan Cirrhosis of the liver with ascites- patient's labs came back today with sodium 146 potassium 3.4 chloride 108 CO2 27 BUN 12 creatinine 0.75; chest x-ray-no infiltrate; patient's last paracentesis was 4/11; patient obviously needs paracentesis; paracentesis has been ordered to be set up for this week, preferably in the next day or 2    Merrilee Seashore, MD

## 2018-05-07 ENCOUNTER — Encounter: Payer: Self-pay | Admitting: Internal Medicine

## 2018-05-07 NOTE — Assessment & Plan Note (Signed)
Stable; continue Remeron 30 mg daily, Zoloft 75 mg daily and trazodone 50 mg daily at bedtime

## 2018-05-07 NOTE — Assessment & Plan Note (Signed)
Stable; continue Aricept 5 mg p.o. daily

## 2018-05-07 NOTE — Assessment & Plan Note (Signed)
Last paracentesis patient has 6 L taken off; negative been to weeks since his prior paracentesis; will plan to have paracentesis weekly

## 2018-11-19 DEATH — deceased

## 2019-12-21 IMAGING — DX DG SHOULDER 2+V*L*
3 series · 3 of 3 positions shown · non-contrast
Comparison: MRI left shoulder 02/10/2005.

CLINICAL DATA: Left shoulder pain and limited range of motion. No
known injury. History of rotator cuff repair.

EXAM:
LEFT SHOULDER - 2+ VIEW

[shoulder grashey]
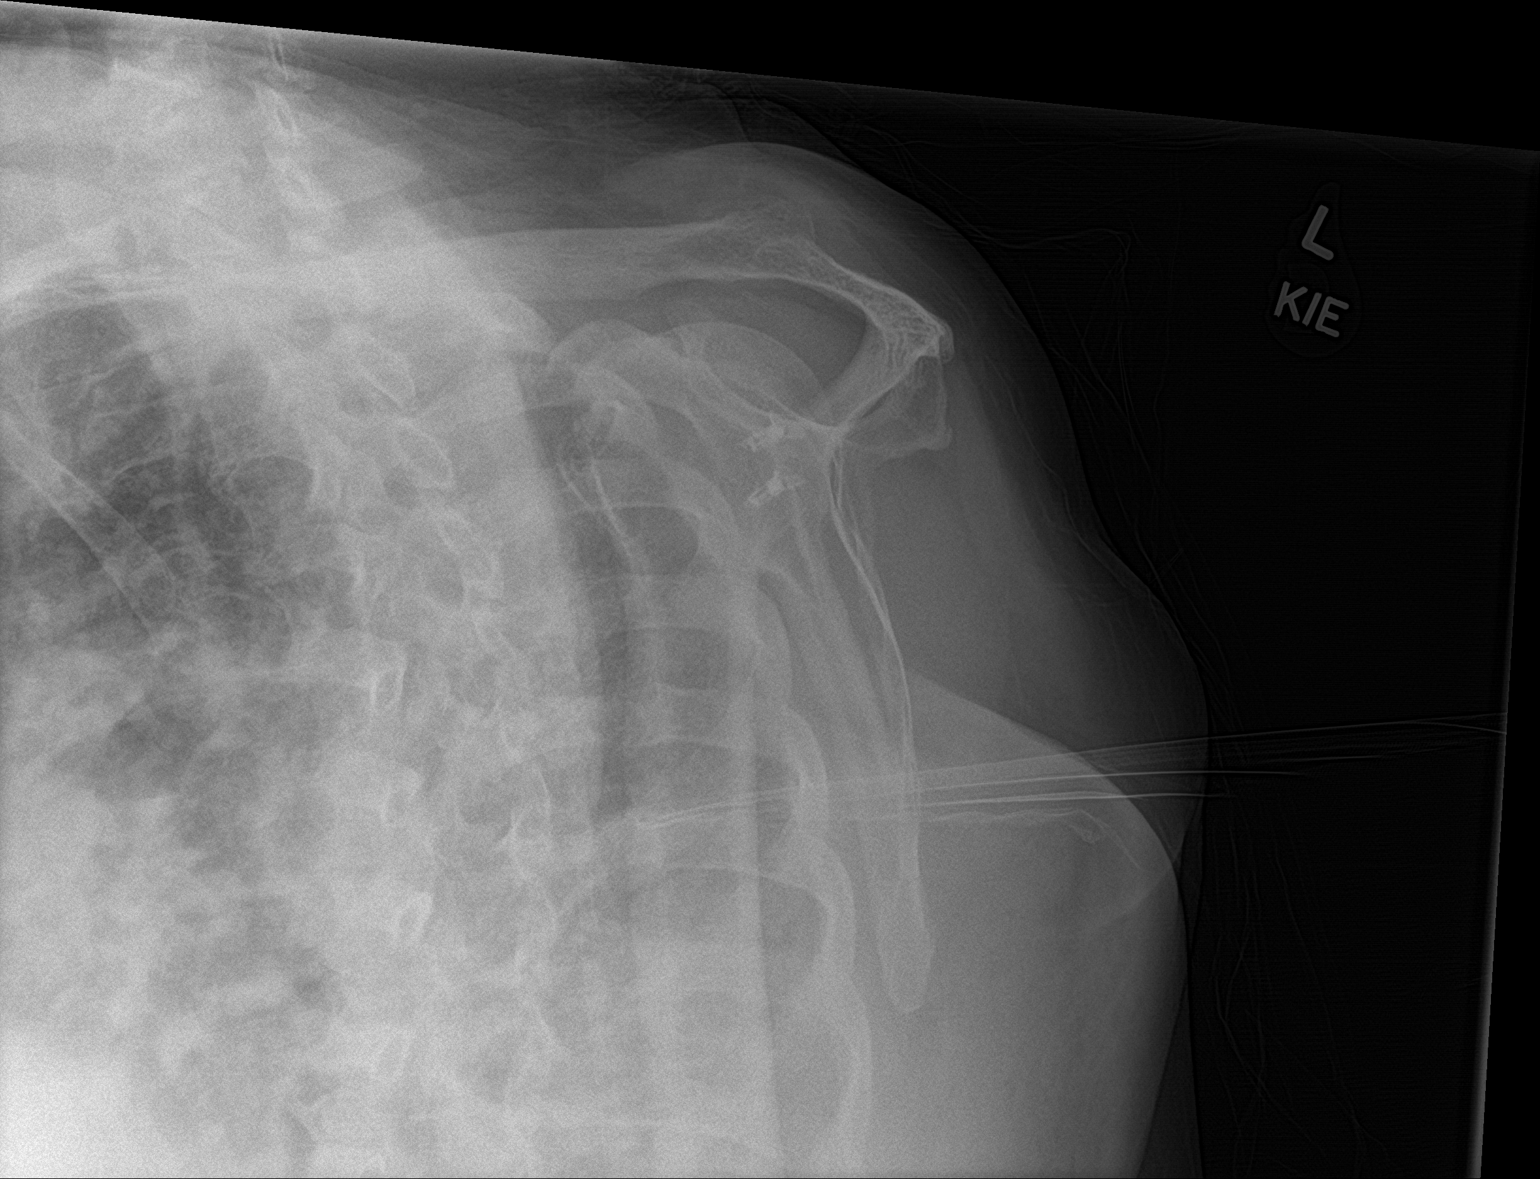

[shoulder y view]
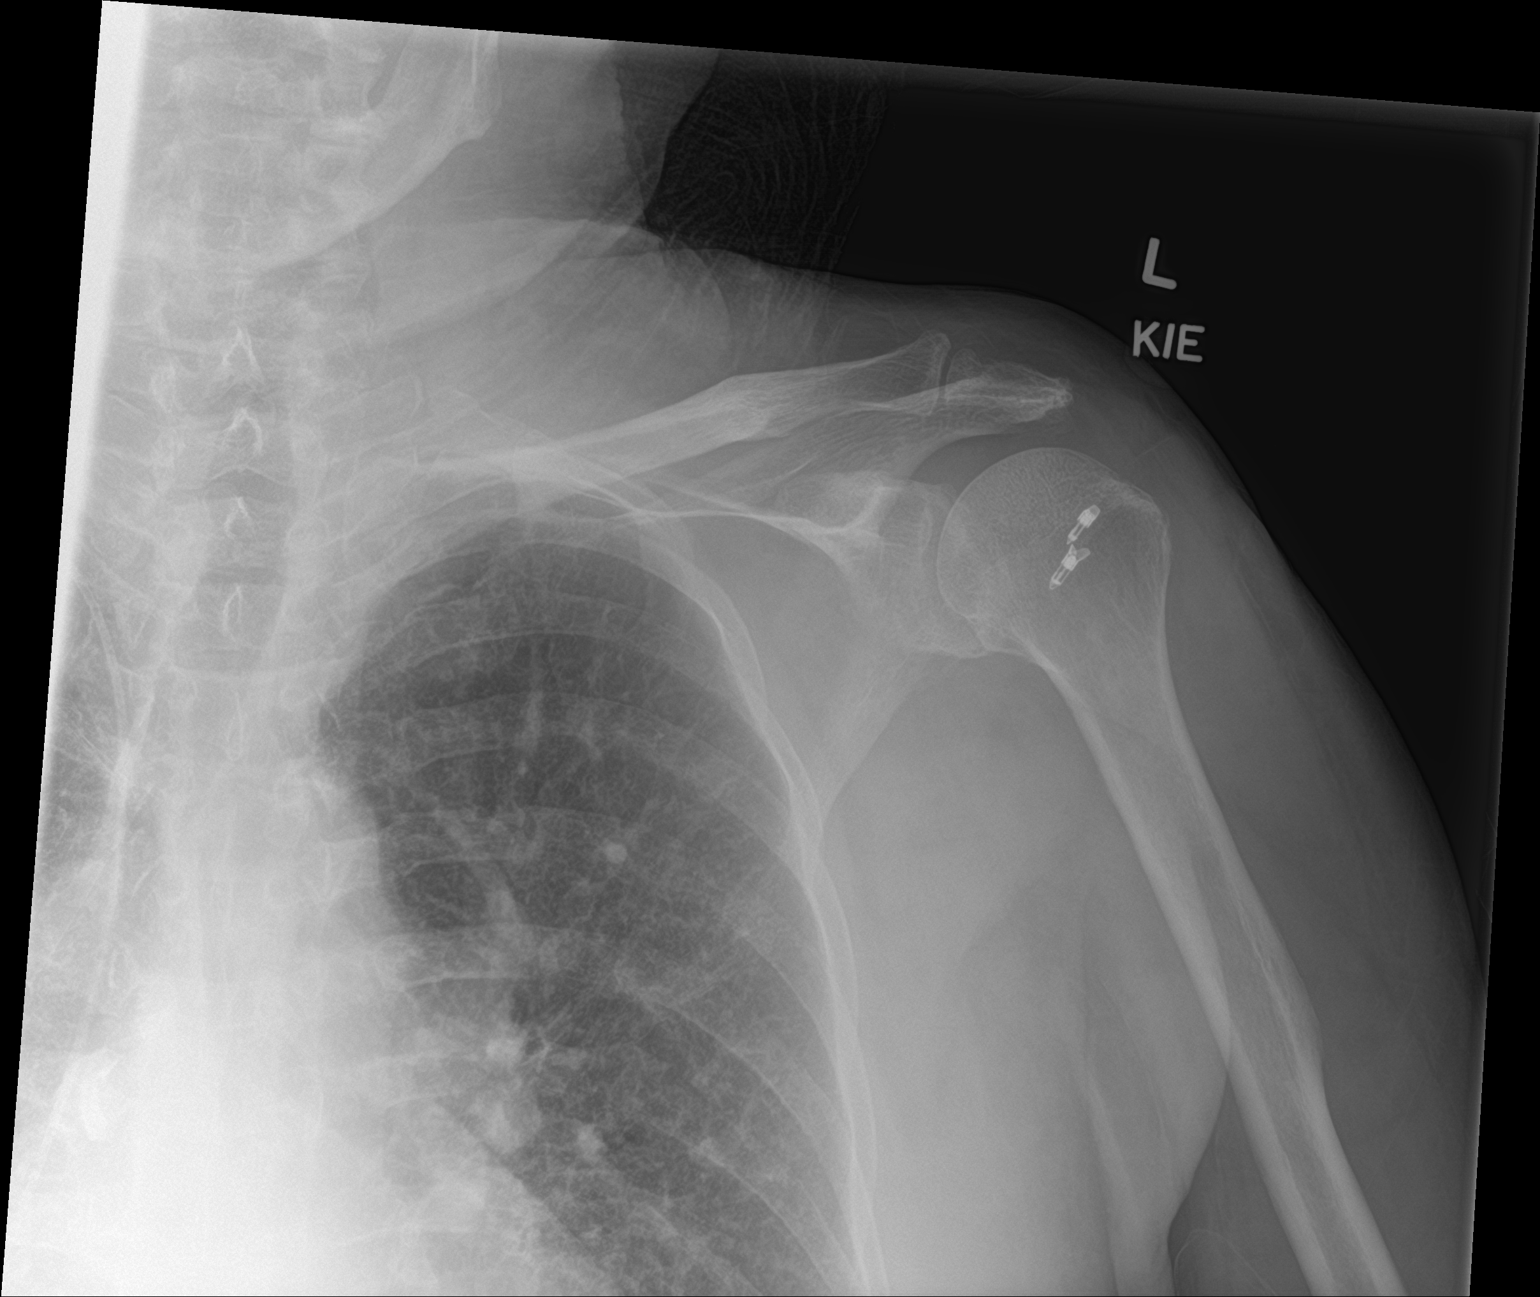

[shoulder ap neutral]
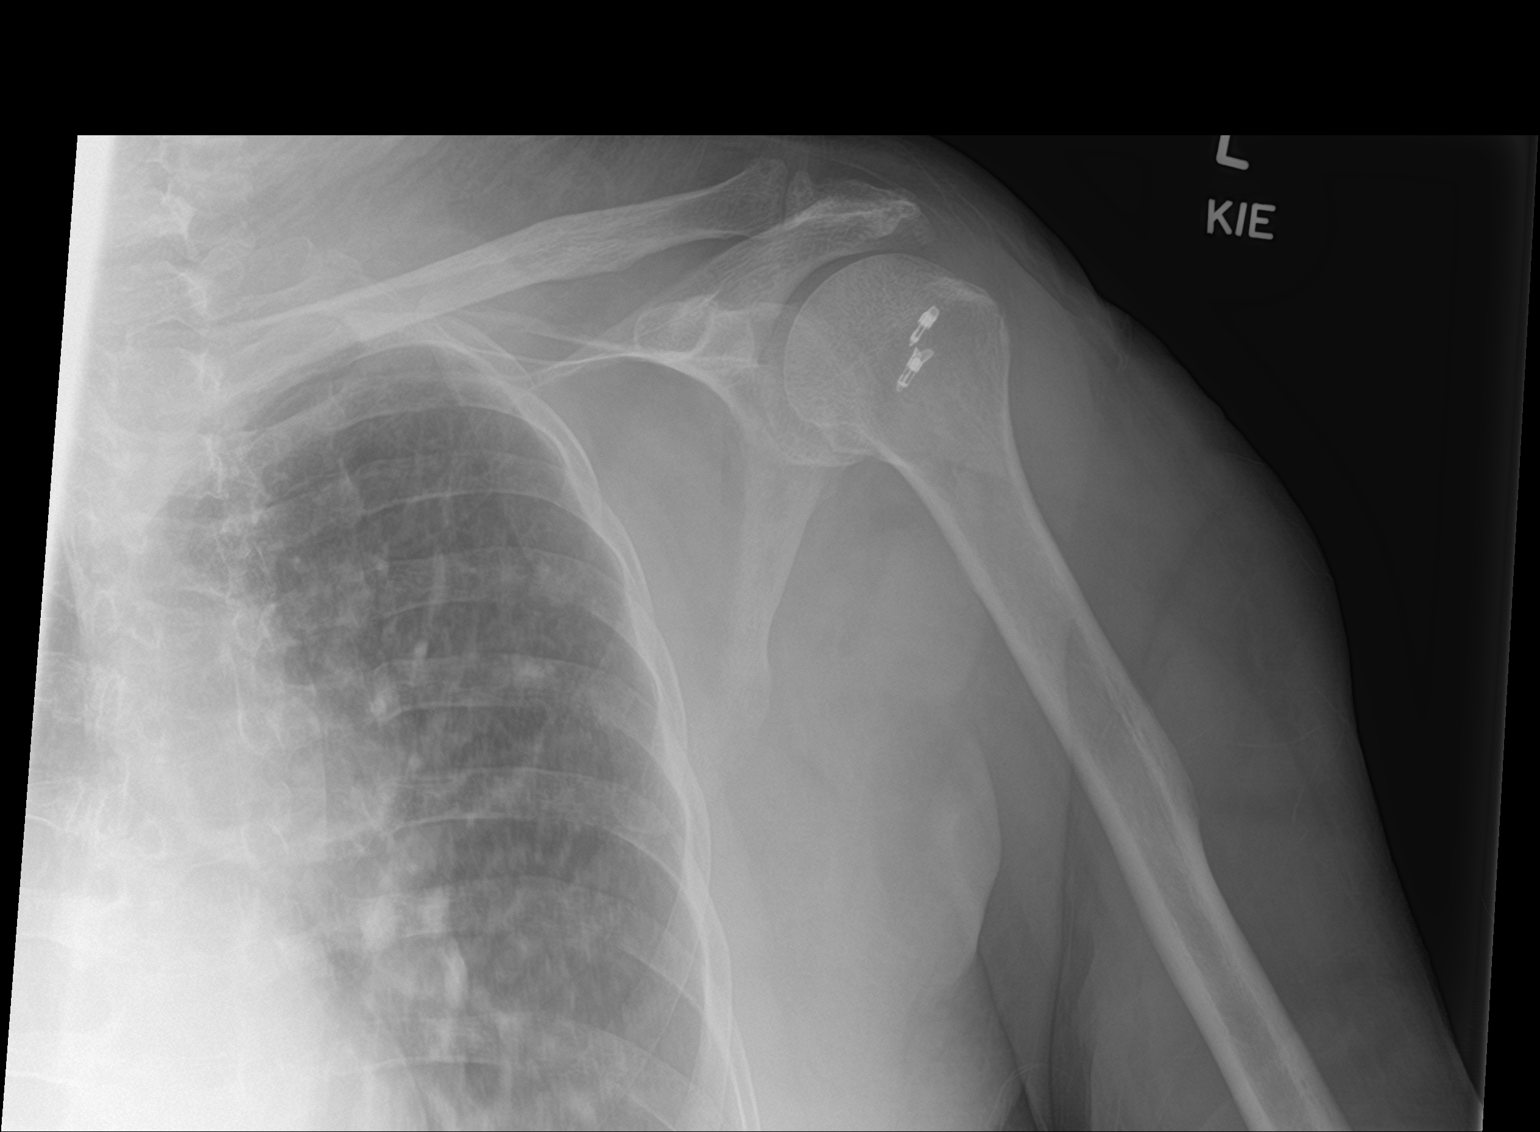

[3 of 3 positions shown; findings below may reference images not displayed]

FINDINGS: There is no acute bony or joint abnormality. Two soft tissue anchors
in the humeral head for rotator cuff repair noted. Mild to moderate
acromioclavicular degenerative change is seen. Glenohumeral joint is
unremarkable. Imaged left lung and ribs are unremarkable. Aortic
atherosclerosis noted.
IMPRESSION: No acute abnormality.

Mild to moderate acromioclavicular osteoarthritis.

## 2019-12-21 IMAGING — CT CT CERVICAL SPINE W/O CM
4 of 8 series · 11 of 33 positions shown, 12 images · non-contrast
Comparison: Head CT scan 10/05/2017.

CLINICAL DATA: Status post fall today. Headache. Initial encounter.

EXAM:
CT HEAD WITHOUT CONTRAST
CT CERVICAL SPINE WITHOUT CONTRAST
TECHNIQUE: Multidetector CT imaging of the head and cervical spine was
performed following the standard protocol without intravenous
contrast. Multiplanar CT image reconstructions of the cervical spine
were also generated.

[Series 7: c_spine 2.0 i30s 3 · axial · 0.40mm/px · z∈[-276,-214]mm · 2 of 95 slices shown]
[im 32/95  bone]
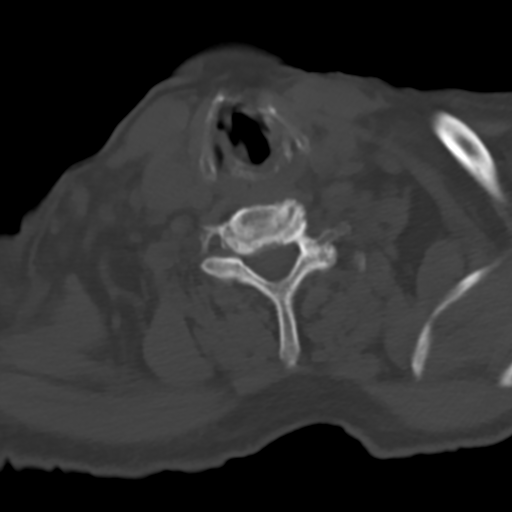
[im 63/95  bone]
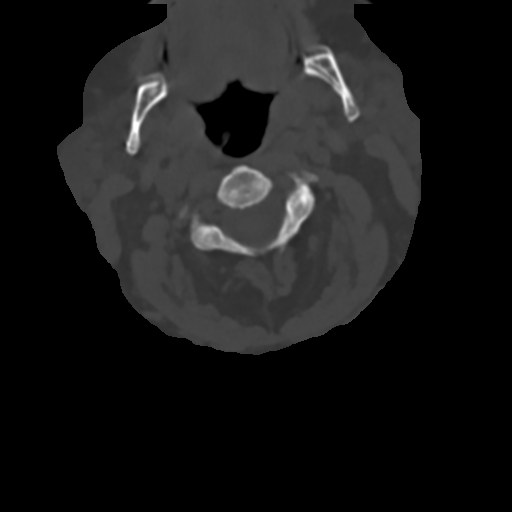

[Series 9: coronals · coronal · 0.26mm/px · 1 of 57 slices shown]
[im 29/57  bone]
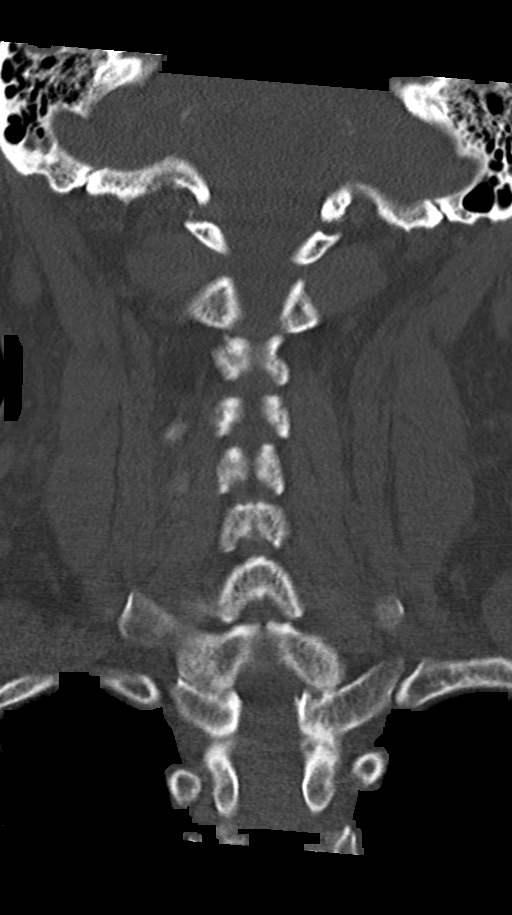

[Series 10: sagittals · sagittal · 0.22mm/px · 5 of 61 slices shown]
[im 11/61  bone]
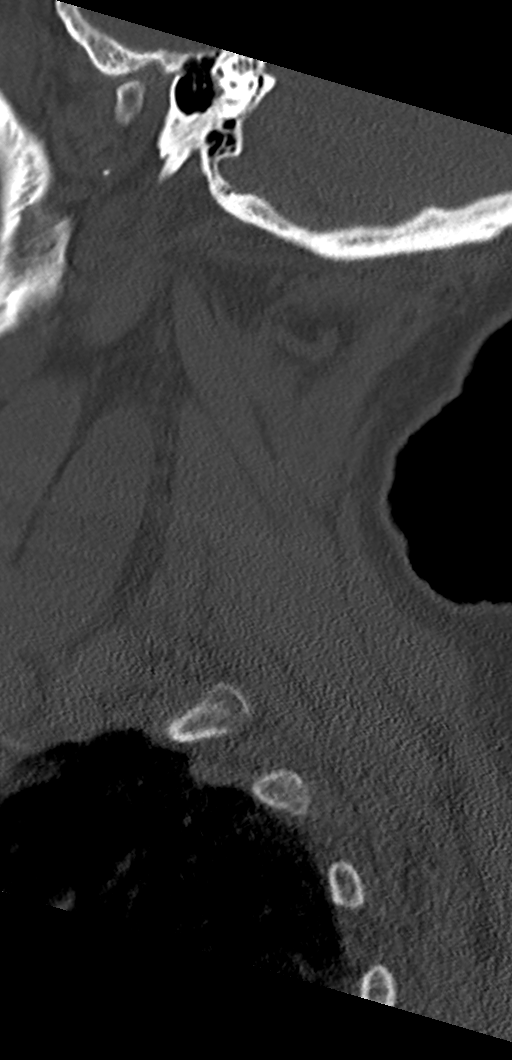
[im 21/61  bone]
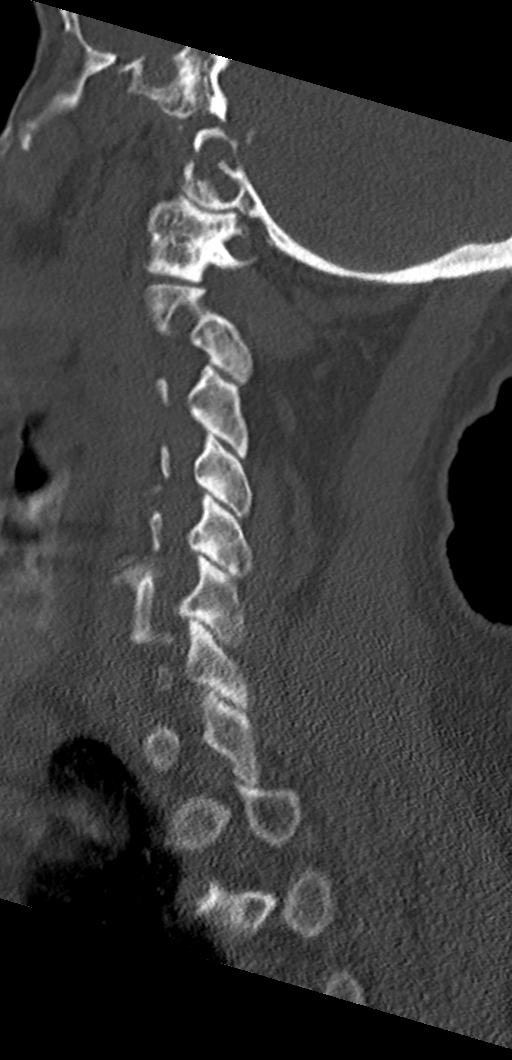
[im 31/61  bone]
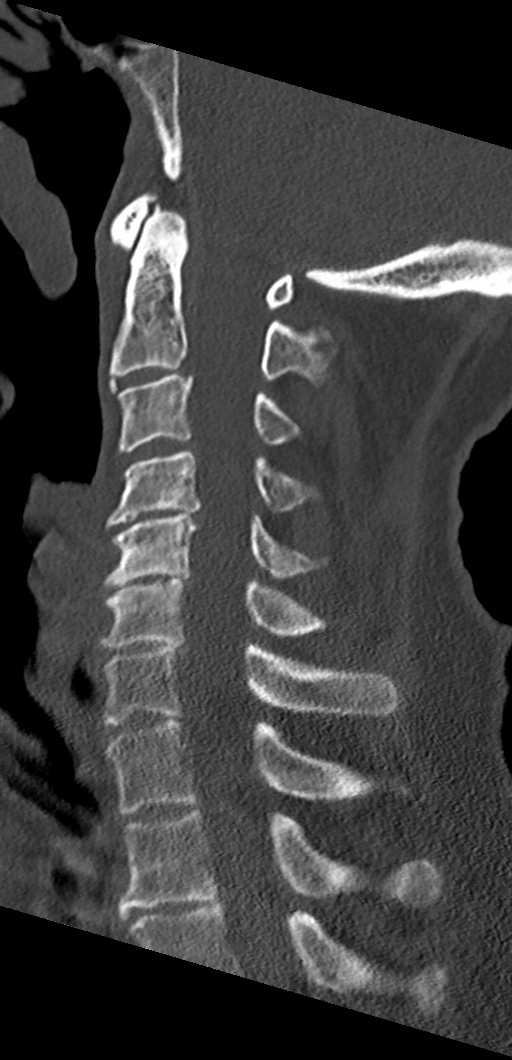
[im 41/61  bone]
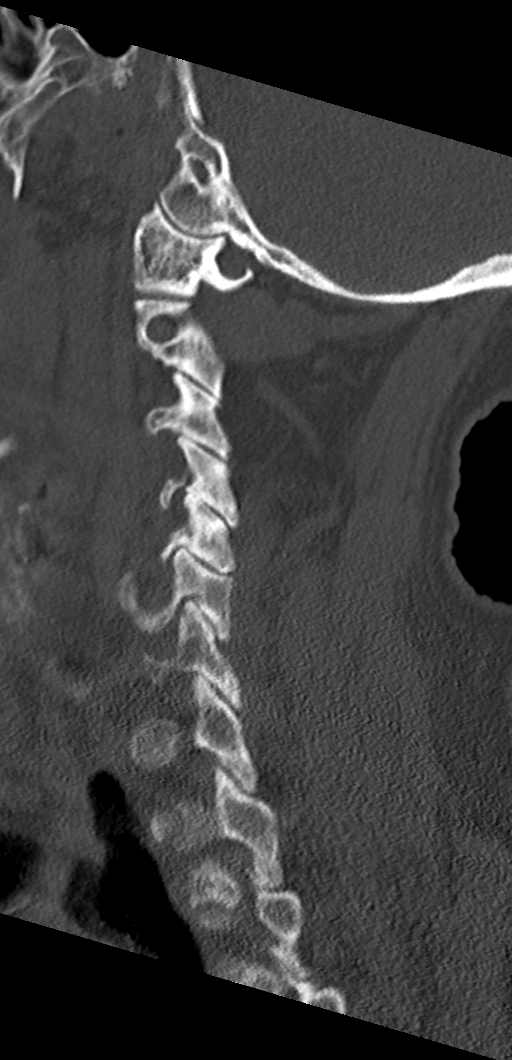
[im 51/61  bone]
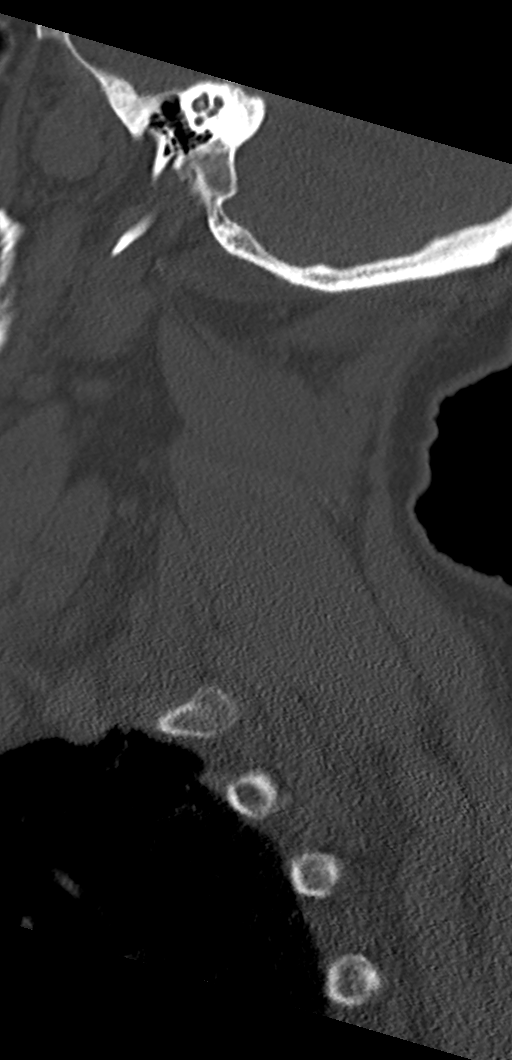

[Series 11: orthogonals · axial · 0.25mm/px · z∈[-327,-216]mm · 3 of 118 slices shown, 4 images]
[im 30/118  soft-tissue]
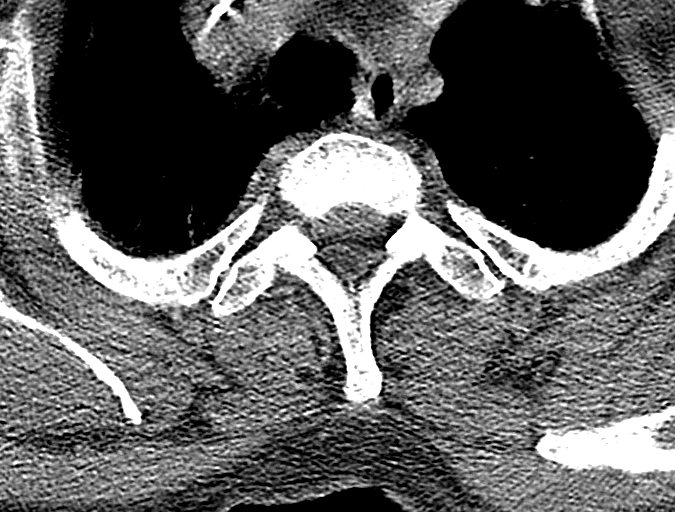
[im 30/118  bone]
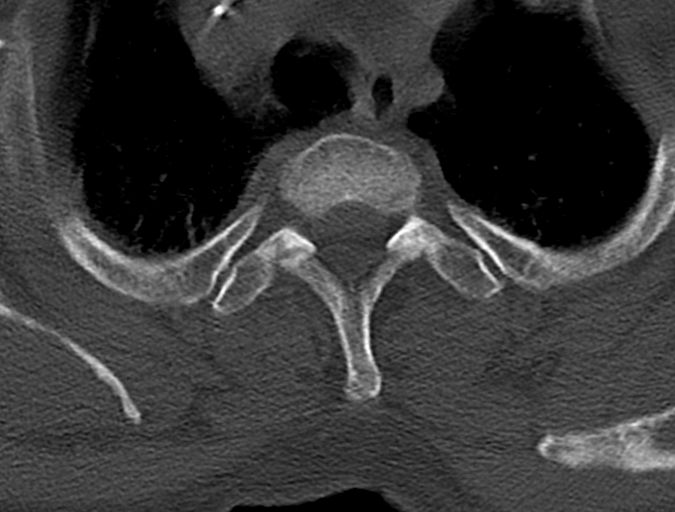
[im 59/118  bone]
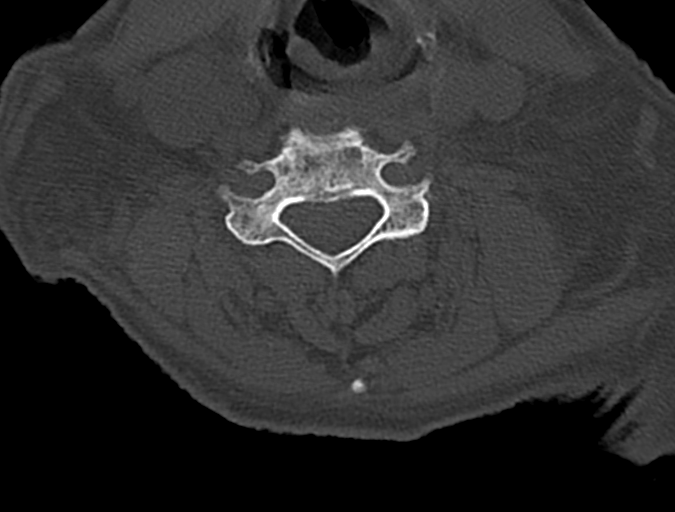
[im 88/118  bone]
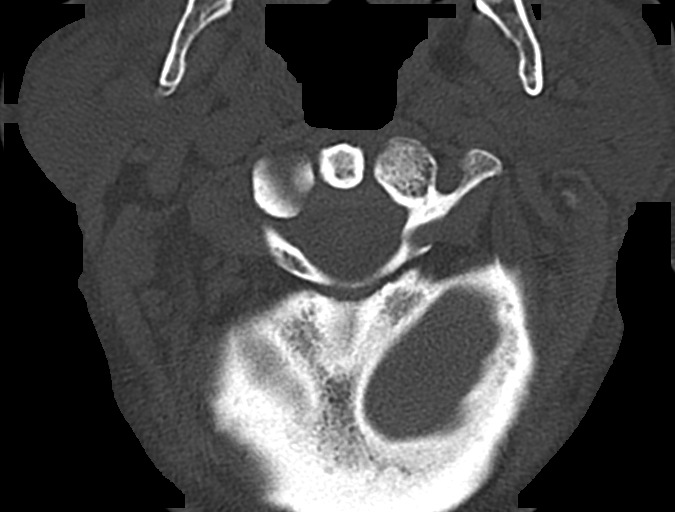

[11 of 33 positions shown; findings below may reference images not displayed]

FINDINGS: CT HEAD FINDINGS

Brain: No evidence of acute infarction, hemorrhage, hydrocephalus,
extra-axial collection or mass lesion/mass effect.

Vascular: Atherosclerosis noted.

Skull: Intact.

Sinuses/Orbits: Status post lens extraction.  Otherwise negative.

Other: None.

CT CERVICAL SPINE FINDINGS

Alignment: Maintained.  Mild reversal of lordosis noted.

Skull base and vertebrae: No acute fracture. No primary bone lesion
or focal pathologic process.

Soft tissues and spinal canal: No prevertebral fluid or swelling. No
visible canal hematoma.

Disc levels: Loss of disc space height appears worst at C4-5, C5-6
and C6-7.

Upper chest: Extensive emphysema is present.  No acute abnormality.

Other: None.
IMPRESSION: No acute abnormality head or cervical spine.

Atherosclerosis.

Degenerative disc disease C4-C7.

Emphysema.
